# Patient Record
Sex: Female | Born: 1985 | Race: White | Hispanic: No | Marital: Single | Smoking: Current every day smoker
Health system: Southern US, Community
[De-identification: ages and names within clinical notes are randomized; demographics above are authoritative.]

## PROBLEM LIST (undated history)

## (undated) DIAGNOSIS — F410 Panic disorder [episodic paroxysmal anxiety] without agoraphobia: Secondary | ICD-10-CM

## (undated) DIAGNOSIS — G4700 Insomnia, unspecified: Secondary | ICD-10-CM

## (undated) DIAGNOSIS — F39 Unspecified mood [affective] disorder: Secondary | ICD-10-CM

## (undated) DIAGNOSIS — Z9889 Other specified postprocedural states: Secondary | ICD-10-CM

## (undated) DIAGNOSIS — F319 Bipolar disorder, unspecified: Secondary | ICD-10-CM

## (undated) DIAGNOSIS — R200 Anesthesia of skin: Secondary | ICD-10-CM

## (undated) DIAGNOSIS — R112 Nausea with vomiting, unspecified: Secondary | ICD-10-CM

## (undated) DIAGNOSIS — F419 Anxiety disorder, unspecified: Secondary | ICD-10-CM

## (undated) DIAGNOSIS — K219 Gastro-esophageal reflux disease without esophagitis: Secondary | ICD-10-CM

## (undated) DIAGNOSIS — O24419 Gestational diabetes mellitus in pregnancy, unspecified control: Secondary | ICD-10-CM

## (undated) DIAGNOSIS — R569 Unspecified convulsions: Secondary | ICD-10-CM

## (undated) HISTORY — DX: Anxiety disorder, unspecified: F41.9

## (undated) HISTORY — DX: Insomnia, unspecified: G47.00

## (undated) HISTORY — DX: Unspecified convulsions: R56.9

## (undated) HISTORY — DX: Anesthesia of skin: R20.0

## (undated) HISTORY — PX: TUBAL LIGATION: SHX77

## (undated) HISTORY — PX: TONSILLECTOMY: SUR1361

## (undated) HISTORY — DX: Panic disorder (episodic paroxysmal anxiety): F41.0

## (undated) HISTORY — DX: Gastro-esophageal reflux disease without esophagitis: K21.9

## (undated) HISTORY — DX: Unspecified mood (affective) disorder: F39

---

## 2011-10-11 ENCOUNTER — Emergency Department (HOSPITAL_COMMUNITY)
Admission: EM | Admit: 2011-10-11 | Discharge: 2011-10-11 | Disposition: A | Discharge status: Home / Self Care | Payer: Medicaid Other | Attending: Emergency Medicine | Admitting: Emergency Medicine

## 2011-10-11 ENCOUNTER — Encounter (HOSPITAL_COMMUNITY): Payer: Self-pay | Admitting: *Deleted

## 2011-10-11 DIAGNOSIS — Z79899 Other long term (current) drug therapy: Secondary | ICD-10-CM | POA: Insufficient documentation

## 2011-10-11 DIAGNOSIS — R109 Unspecified abdominal pain: Secondary | ICD-10-CM | POA: Insufficient documentation

## 2011-10-11 DIAGNOSIS — N390 Urinary tract infection, site not specified: Secondary | ICD-10-CM

## 2011-10-11 DIAGNOSIS — F17200 Nicotine dependence, unspecified, uncomplicated: Secondary | ICD-10-CM | POA: Insufficient documentation

## 2011-10-11 LAB — CBC WITH DIFFERENTIAL/PLATELET
Basophils Absolute: 0 10*3/uL (ref 0.0–0.1)
Basophils Relative: 0 % (ref 0–1)
Eosinophils Absolute: 0.4 10*3/uL (ref 0.0–0.7)
Eosinophils Relative: 3 % (ref 0–5)
HCT: 40.3 % (ref 36.0–46.0)
MCH: 30.7 pg (ref 26.0–34.0)
MCHC: 34.2 g/dL (ref 30.0–36.0)
MCV: 89.8 fL (ref 78.0–100.0)
Monocytes Absolute: 1.4 10*3/uL — ABNORMAL HIGH (ref 0.1–1.0)
Platelets: 162 10*3/uL (ref 150–400)
RDW: 13.5 % (ref 11.5–15.5)
WBC: 13.1 10*3/uL — ABNORMAL HIGH (ref 4.0–10.5)

## 2011-10-11 LAB — URINE MICROSCOPIC-ADD ON

## 2011-10-11 LAB — COMPREHENSIVE METABOLIC PANEL
ALT: 9 U/L (ref 0–35)
AST: 13 U/L (ref 0–37)
Calcium: 9.4 mg/dL (ref 8.4–10.5)
Creatinine, Ser: 0.78 mg/dL (ref 0.50–1.10)
GFR calc non Af Amer: >90 mL/min (ref >90)
Sodium: 136 mEq/L (ref 135–145)
Total Protein: 6.9 g/dL (ref 6.0–8.3)

## 2011-10-11 LAB — URINALYSIS, ROUTINE W REFLEX MICROSCOPIC
Bilirubin Urine: NEGATIVE
Ketones, ur: NEGATIVE mg/dL
Protein, ur: 100 mg/dL — AB
Urobilinogen, UA: 0.2 mg/dL (ref 0.0–1.0)

## 2011-10-11 MED ORDER — TRAMADOL HCL 50 MG PO TABS: 50.0000 mg | ORAL_TABLET | Freq: Four times a day (QID) | ORAL | Status: AC | PRN

## 2011-10-11 MED ORDER — CEPHALEXIN 500 MG PO CAPS
500.0000 mg | ORAL_CAPSULE | Freq: Once | ORAL | Status: AC
  Administered 2011-10-11: 500 mg via ORAL
  Filled 2011-10-11: qty 1

## 2011-10-11 MED ORDER — CEPHALEXIN 500 MG PO CAPS: 500.0000 mg | ORAL_CAPSULE | Freq: Four times a day (QID) | ORAL | Status: AC

## 2011-10-11 NOTE — ED Notes (Signed)
Delay explained to pt, awaiting md and test results.

## 2011-10-11 NOTE — ED Provider Notes (Cosign Needed)
History     CSN: [id number]  Arrival date & time 10/11/11  1214   First MD Initiated Contact with Patient 10/11/11 1318      Chief Complaint  Patient presents with  . Abdominal Pain    (Consider location/radiation/quality/duration/timing/severity/associated sxs/prior treatment) Patient is a 26 y.o. female presenting with abdominal pain. The history is provided by the patient (the pt complains of lower abd pain). No language interpreter was used.  Abdominal Pain The primary symptoms of the illness include abdominal pain. The primary symptoms of the illness do not include fever, fatigue or diarrhea. The current episode started 2 days ago. The onset of the illness was gradual. The problem has not changed since onset. Associated with: worse with urination. The patient states that she believes she is currently not pregnant. The patient has not had a change in bowel habit. Symptoms associated with the illness do not include chills, hematuria, frequency or back pain. Significant associated medical issues do not include PUD.    History reviewed. No pertinent past medical history.  Past Surgical History  Procedure Date  . Cesarean section   . Tonsillectomy     History reviewed. No pertinent family history.  History  Substance Use Topics  . Smoking status: Current Some Day Smoker  . Smokeless tobacco: Not on file  . Alcohol Use: Yes    OB History    Grav Para Term Preterm Abortions TAB SAB Ect Mult Living                  Review of Systems  Constitutional: Negative for fever, chills and fatigue.  HENT: Negative for congestion, sinus pressure and ear discharge.   Eyes: Negative for discharge.  Respiratory: Negative for cough.   Cardiovascular: Negative for chest pain.  Gastrointestinal: Positive for abdominal pain. Negative for diarrhea.  Genitourinary: Negative for frequency and hematuria.  Musculoskeletal: Negative for back pain.  Skin: Negative for rash.  Neurological:  Negative for seizures and headaches.  Hematological: Negative.   Psychiatric/Behavioral: Negative for hallucinations.    Allergies  Morphine and related  Home Medications   Current Outpatient Rx  Name Route Sig Dispense Refill  . ACETAMINOPHEN 500 MG PO TABS Oral Take 1,000 mg by mouth every 6 (six) hours as needed. For pain    . GOODYS BODY PAIN PO Oral Take 1 packet by mouth daily as needed. For pain    . IBUPROFEN 200 MG PO TABS Oral Take 800 mg by mouth every 8 (eight) hours as needed. For pain    . ACIDOPHILUS PO Oral Take 1 capsule by mouth daily.    Marland Kitchen LEVONORGESTREL 20 MCG/24HR IU IUD Intrauterine 1 each by Intrauterine route once.    Marland Kitchen LYSINE PO Oral Take 1 tablet by mouth daily.    Marland Kitchen VITAMIN B-12 100 MCG PO TABS Oral Take 100 mcg by mouth daily.    . CEPHALEXIN 500 MG PO CAPS Oral Take 1 capsule (500 mg total) by mouth 4 (four) times daily. 28 capsule 0  . TRAMADOL HCL 50 MG PO TABS Oral Take 1 tablet (50 mg total) by mouth every 6 (six) hours as needed for pain. 15 tablet 0    BP 122/62  Pulse 83  Temp 98.4 F (36.9 C) (Oral)  Resp 18  Ht 5\' 2"  (1.575 m)  Wt 151 lb (68.493 kg)  BMI 27.62 kg/m2  SpO2 97%  LMP 09/03/2011  Physical Exam  Constitutional: She is oriented to person, place, and time. She  appears well-developed.  HENT:  Head: Normocephalic and atraumatic.  Eyes: Conjunctivae and EOM are normal. No scleral icterus.  Neck: Neck supple. No thyromegaly present.  Cardiovascular: Normal rate and regular rhythm.  Exam reveals no gallop and no friction rub.   No murmur heard. Pulmonary/Chest: No stridor. She has no wheezes. She has no rales. She exhibits no tenderness.  Abdominal: She exhibits no distension. There is tenderness. There is no rebound.       Tender right lower quad.  mild  Musculoskeletal: Normal range of motion. She exhibits no edema.  Lymphadenopathy:    She has no cervical adenopathy.  Neurological: She is oriented to person, place, and  time. Coordination normal.  Skin: No rash noted. No erythema.  Psychiatric: She has a normal mood and affect. Her behavior is normal.    ED Course  Procedures (including critical care time)  Labs Reviewed  URINALYSIS, ROUTINE W REFLEX MICROSCOPIC - Abnormal; Notable for the following:    APPearance CLOUDY (*)     Hgb urine dipstick MODERATE (*)     Protein, ur 100 (*)     Nitrite POSITIVE (*)     Leukocytes, UA MODERATE (*)     All other components within normal limits  CBC WITH DIFFERENTIAL - Abnormal; Notable for the following:    WBC 13.1 (*)     Neutro Abs 9.4 (*)     Monocytes Absolute 1.4 (*)     All other components within normal limits  COMPREHENSIVE METABOLIC PANEL - Abnormal; Notable for the following:    Glucose, Bld 108 (*)     All other components within normal limits  URINE MICROSCOPIC-ADD ON - Abnormal; Notable for the following:    Bacteria, UA MANY (*)     All other components within normal limits  PREGNANCY, URINE  URINE CULTURE   No results found.   1. UTI (lower urinary tract infection)       MDM          Benny Lennert, MD 10/11/11 1540

## 2011-10-11 NOTE — ED Notes (Signed)
Pt awaiting md.  

## 2011-10-11 NOTE — ED Notes (Signed)
Pt reports left lower quad pain yesterday, that has gotten better and now having right lower quad pain, also has noticed at foul odor from vagina for several days, denies any discharge.  Or fever, she thinks she may have uti.  She also felt for her iud and "it felt lower than normal"

## 2011-10-11 NOTE — ED Notes (Signed)
Had LLQ pain yesterday, now RLQ pain,Nausea, no vomiting,   No diarrhea.

## 2011-10-13 LAB — URINE CULTURE

## 2011-10-14 NOTE — ED Notes (Signed)
+  Urine. Patient given Keflex. Sensitive to same. Per protocol MD. °

## 2012-02-19 ENCOUNTER — Other Ambulatory Visit: Payer: Self-pay | Admitting: Neurology

## 2012-02-19 DIAGNOSIS — R209 Unspecified disturbances of skin sensation: Secondary | ICD-10-CM

## 2012-02-26 ENCOUNTER — Ambulatory Visit (HOSPITAL_COMMUNITY)
Admission: RE | Admit: 2012-02-26 | Discharge: 2012-02-26 | Disposition: A | Discharge status: Home / Self Care | Payer: Medicaid Other | Source: Ambulatory Visit | Attending: Neurology | Admitting: Neurology

## 2012-02-26 DIAGNOSIS — R209 Unspecified disturbances of skin sensation: Secondary | ICD-10-CM | POA: Insufficient documentation

## 2012-02-26 DIAGNOSIS — R42 Dizziness and giddiness: Secondary | ICD-10-CM | POA: Insufficient documentation

## 2012-12-05 ENCOUNTER — Emergency Department (HOSPITAL_COMMUNITY): Payer: Medicaid Other

## 2012-12-05 ENCOUNTER — Emergency Department (HOSPITAL_COMMUNITY)
Admission: EM | Admit: 2012-12-05 | Discharge: 2012-12-05 | Disposition: A | Discharge status: Home / Self Care | Payer: Medicaid Other | Attending: Emergency Medicine | Admitting: Emergency Medicine

## 2012-12-05 ENCOUNTER — Encounter (HOSPITAL_COMMUNITY): Payer: Self-pay | Admitting: Emergency Medicine

## 2012-12-05 DIAGNOSIS — Z3202 Encounter for pregnancy test, result negative: Secondary | ICD-10-CM | POA: Insufficient documentation

## 2012-12-05 DIAGNOSIS — W010XXA Fall on same level from slipping, tripping and stumbling without subsequent striking against object, initial encounter: Secondary | ICD-10-CM | POA: Insufficient documentation

## 2012-12-05 DIAGNOSIS — Z8632 Personal history of gestational diabetes: Secondary | ICD-10-CM | POA: Insufficient documentation

## 2012-12-05 DIAGNOSIS — Z79899 Other long term (current) drug therapy: Secondary | ICD-10-CM | POA: Insufficient documentation

## 2012-12-05 DIAGNOSIS — Y9301 Activity, walking, marching and hiking: Secondary | ICD-10-CM | POA: Insufficient documentation

## 2012-12-05 DIAGNOSIS — F17200 Nicotine dependence, unspecified, uncomplicated: Secondary | ICD-10-CM | POA: Insufficient documentation

## 2012-12-05 DIAGNOSIS — S300XXA Contusion of lower back and pelvis, initial encounter: Secondary | ICD-10-CM | POA: Insufficient documentation

## 2012-12-05 DIAGNOSIS — Y92009 Unspecified place in unspecified non-institutional (private) residence as the place of occurrence of the external cause: Secondary | ICD-10-CM | POA: Insufficient documentation

## 2012-12-05 HISTORY — DX: Gestational diabetes mellitus in pregnancy, unspecified control: O24.419

## 2012-12-05 MED ORDER — IBUPROFEN 800 MG PO TABS: 800.0000 mg | ORAL_TABLET | Freq: Three times a day (TID) | ORAL | Status: DC

## 2012-12-05 NOTE — ED Provider Notes (Signed)
CSN: [id number]     Arrival date & time 12/05/12  1946 History  This chart was scribed for non-physician practitioner Jaynie Crumble, PA-C working with Flint Melter, MD by Valera Castle, ED scribe. This patient was seen in room TR09C/TR09C and the patient's care was started at 8:17 PM.   Chief Complaint  Patient presents with  . Fall    HPI HPI Comments: Sophia Douglas is a 27 y.o. female who presents to the Emergency Department complaining of an accidental fall that occurred last night. She states she was walking on carpeted steps, slipped an fell on her buttocks. She reports constant, moderate pain to the area of her sacrum/coccyx that is made worse by sitting. She denies head injury and LOC. She denies any previous injury. She has tried ibuprofen and tylenol with no relief. She states the pain does not radiate to her lower extremities. She reports she is unsure if she is pregnant.    Past Medical History  Diagnosis Date  . Gestational diabetes    Past Surgical History  Procedure Laterality Date  . Cesarean section    . Tonsillectomy     No family history on file. History  Substance Use Topics  . Smoking status: Current Some Day Smoker  . Smokeless tobacco: Not on file  . Alcohol Use: Yes   OB History   Grav Para Term Preterm Abortions TAB SAB Ect Mult Living                 Review of Systems  HENT: Negative for neck pain.   Musculoskeletal: Positive for arthralgias (Sacrum/coccyx).  Neurological: Negative for syncope and headaches.  All other systems reviewed and are negative.    Allergies  Tramadol; Latex; and Morphine and related  Home Medications   Current Outpatient Rx  Name  Route  Sig  Dispense  Refill  . acetaminophen (TYLENOL) 500 MG tablet   Oral   Take 1,000 mg by mouth every 6 (six) hours as needed. For pain         . ALPRAZolam (XANAX) 0.25 MG tablet   Oral   Take 0.25 mg by mouth 3 (three) times daily.         . ARIPiprazole (ABILIFY)  5 MG tablet   Oral   Take 5 mg by mouth daily.         . Aspirin-Acetaminophen (GOODYS BODY PAIN PO)   Oral   Take 1 packet by mouth daily as needed. For pain         . desogestrel-ethinyl estradiol (VELIVET,CAZIANT,CESIA,CYCLESSA) 0.1/0.125/0.15 -0.025 MG tablet   Oral   Take 1 tablet by mouth daily.         Marland Kitchen ibuprofen (ADVIL,MOTRIN) 200 MG tablet   Oral   Take 400 mg by mouth every 8 (eight) hours as needed. For pain         . Lactobacillus (ACIDOPHILUS PO)   Oral   Take 1 capsule by mouth daily.         Marland Kitchen LYSINE PO   Oral   Take 1 tablet by mouth as needed (cold sore).          Marland Kitchen omeprazole (PRILOSEC) 20 MG capsule   Oral   Take 20 mg by mouth daily.         . sertraline (ZOLOFT) 100 MG tablet   Oral   Take 100 mg by mouth daily.         . vitamin B-12 (CYANOCOBALAMIN) 100  MCG tablet   Oral   Take 100 mcg by mouth daily.         Marland Kitchen zolpidem (AMBIEN) 10 MG tablet   Oral   Take 10 mg by mouth at bedtime.          Triage Vitals: BP 127/86  Pulse 89  Temp(Src) 99.2 F (37.3 C) (Oral)  Resp 14  SpO2 100%  Physical Exam  Nursing note and vitals reviewed. Constitutional: She is oriented to person, place, and time. She appears well-developed and well-nourished. No distress.  HENT:  Head: Normocephalic and atraumatic.  Eyes: EOM are normal.  Neck: Neck supple. No tracheal deviation present.  Cardiovascular: Normal rate.   Pulmonary/Chest: Effort normal. No respiratory distress.  Musculoskeletal: Normal range of motion.  No midline l-spine tenderness. Tender over right paravertebral muscles of mid l-spine. Midline sacral, and coccyx tenderness.  Neurological: She is alert and oriented to person, place, and time.  5/5 and equal strength in the lower extremities.   Skin: Skin is warm and dry.  Psychiatric: She has a normal mood and affect. Her behavior is normal.    ED Course  Procedures (including critical care time)  DIAGNOSTIC  STUDIES: Oxygen Saturation is 100% on room air, normal by my interpretation.    COORDINATION OF CARE: 8:20 PM-Discussed treatment plan which includes obtaining an xray of her sacrum/coccyx, after first ruling out pregnancy with pt at bedside and pt agreed to plan.      Labs Review Labs Reviewed - No data to display Imaging Review No results found.  MDM   1. Contusion of coccyx, initial encounter     Patient in emergency department with pain to her tail bone and sacrum after falling onto it yesterday while she slipped on the stairs. Patient is tender to palpation of her sacrum and tailbone, no deformity noted. X-rays obtained and are negative. I suspect she has a sacral and tailbone contusion. She is neurovascularly intact. She is ambulatory. She is in no distress. She has no complaints. There is no other injuries during the fall. She'll be treated with ibuprofen for pain and follow up as needed    Filed Vitals:   12/05/12 1955  BP: 127/86  Pulse: 89  Temp: 99.2 F (37.3 C)  TempSrc: Oral  Resp: 14  SpO2: 100%   I personally performed the services described in this documentation, which was scribed in my presence. The recorded information has been reviewed and is accurate.    Lottie Mussel, PA-C 12/05/12 2106

## 2012-12-05 NOTE — ED Notes (Signed)
Pt. tripped and fell last night at friend's house , no LOC / ambulatory , reports pain at sacral area.

## 2012-12-05 NOTE — ED Notes (Signed)
Pt has ride home.

## 2012-12-06 NOTE — ED Provider Notes (Signed)
Medical screening examination/treatment/procedure(s) were performed by non-physician practitioner and as supervising physician I was immediately available for consultation/collaboration.  Flint Melter, MD 12/06/12 [phone]

## 2013-01-14 ENCOUNTER — Ambulatory Visit: Payer: Medicaid Other | Admitting: Diagnostic Neuroimaging

## 2014-08-19 ENCOUNTER — Ambulatory Visit (INDEPENDENT_AMBULATORY_CARE_PROVIDER_SITE_OTHER): Payer: Medicaid Other | Admitting: Neurology

## 2014-08-19 ENCOUNTER — Encounter: Payer: Self-pay | Admitting: Neurology

## 2014-08-19 VITALS — BP 143/98 | HR 84 | Ht 62.0 in | Wt 203.0 lb

## 2014-08-19 DIAGNOSIS — R202 Paresthesia of skin: Secondary | ICD-10-CM | POA: Diagnosis not present

## 2014-08-19 MED ORDER — LAMOTRIGINE 25 MG PO TABS: ORAL_TABLET | ORAL | Status: DC

## 2014-08-19 MED ORDER — LAMICTAL 100 MG PO TABS: ORAL_TABLET | ORAL | Status: DC
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
## 2014-08-19 NOTE — Addendum Note (Signed)
Addended by: Levert FeinsteinYAN, Pearse Shiffler on: 08/19/2014 08:59 AM   Modules accepted: Orders

## 2014-09-02 ENCOUNTER — Ambulatory Visit
Admission: RE | Admit: 2014-09-02 | Discharge: 2014-09-02 | Disposition: A | Discharge status: Home / Self Care | Payer: Medicaid Other | Source: Ambulatory Visit | Attending: Neurology | Admitting: Neurology

## 2014-09-02 ENCOUNTER — Ambulatory Visit: Payer: Medicaid Other

## 2014-09-02 ENCOUNTER — Encounter (INDEPENDENT_AMBULATORY_CARE_PROVIDER_SITE_OTHER): Payer: Medicaid Other | Admitting: Diagnostic Neuroimaging

## 2014-09-02 DIAGNOSIS — R202 Paresthesia of skin: Secondary | ICD-10-CM

## 2014-09-02 MED ORDER — GADOBENATE DIMEGLUMINE 529 MG/ML IV SOLN
20.0000 mL | Freq: Once | INTRAVENOUS | Status: AC | PRN
  Administered 2014-09-02: 20 mL via INTRAVENOUS

## 2014-09-03 ENCOUNTER — Telehealth: Payer: Self-pay

## 2014-09-03 ENCOUNTER — Telehealth: Payer: Self-pay | Admitting: *Deleted

## 2014-09-03 NOTE — Telephone Encounter (Signed)
-----   Message from Levert FeinsteinYijun Yan, MD sent at 09/03/2014  1:10 PM EDT ----- Please call pt for normal MRI brain.

## 2014-09-03 NOTE — Telephone Encounter (Signed)
Reviewed, MRI brain is normal, EEG is pending, will have follow up in June 22nd, will go over plan at that time, lamictal is working for her now

## 2014-09-03 NOTE — Telephone Encounter (Signed)
Patient aware of normal results

## 2014-09-03 NOTE — Telephone Encounter (Signed)
Patient informed of normal MRI, she states Lamictal is working and she is keeping a Journal of events.  She would like for Dr. Terrace ArabiaYan to call and give her a plan of care

## 2014-09-08 ENCOUNTER — Ambulatory Visit (INDEPENDENT_AMBULATORY_CARE_PROVIDER_SITE_OTHER): Payer: Medicaid Other | Admitting: Neurology

## 2014-09-08 DIAGNOSIS — R202 Paresthesia of skin: Secondary | ICD-10-CM

## 2014-09-11 NOTE — Procedures (Signed)
   HISTORY: 29 years old female, presenting with recurrent left-sided twitching, paresthesia  TECHNIQUE:  16 channel EEG was performed based on standard 10-16 international system. One channel was dedicated to EKG, which has demonstrates normal sinus rhythm of 78 beats per minutes.  Upon awakening, the posterior background activity was well-developed, in alpha range 10 Hz, with amplitude of 40 microvoltage, reactive to eye opening and closure.  There was no evidence of epileptiform discharge.  Photic stimulation was performed, which induced a symmetric photic driving.  Hyperventilation was performed, there was no abnormality elicit.  No sleep was achieved.  CONCLUSION: This is a  normal awake EEG.  There is no electrodiagnostic evidence of epileptiform discharge

## 2014-09-23 ENCOUNTER — Ambulatory Visit (INDEPENDENT_AMBULATORY_CARE_PROVIDER_SITE_OTHER): Payer: Medicaid Other | Admitting: Neurology

## 2014-09-23 ENCOUNTER — Encounter: Payer: Self-pay | Admitting: Neurology

## 2014-09-23 VITALS — BP 114/68 | HR 72 | Ht 62.0 in | Wt 205.0 lb

## 2014-09-23 DIAGNOSIS — R569 Unspecified convulsions: Secondary | ICD-10-CM | POA: Diagnosis not present

## 2014-09-23 DIAGNOSIS — G43009 Migraine without aura, not intractable, without status migrainosus: Secondary | ICD-10-CM

## 2014-09-23 DIAGNOSIS — R202 Paresthesia of skin: Secondary | ICD-10-CM | POA: Diagnosis not present

## 2014-09-23 MED ORDER — LAMICTAL 100 MG PO TABS: ORAL_TABLET | ORAL | Status: DC

## 2014-09-23 MED ORDER — RIZATRIPTAN BENZOATE 5 MG PO TBDP: 5.0000 mg | ORAL_TABLET | ORAL | Status: DC | PRN
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
## 2014-12-01 ENCOUNTER — Telehealth: Payer: Self-pay | Admitting: Neurology

## 2014-12-01 NOTE — Telephone Encounter (Signed)
Pt uses transportation to come the office and is going to come for lab work.Marland KitchenMarland KitchenThey will call to check appt time , Sept 15 at 8am. Please relay this to the person calling to verify.

## 2014-12-17 ENCOUNTER — Other Ambulatory Visit (INDEPENDENT_AMBULATORY_CARE_PROVIDER_SITE_OTHER): Payer: Self-pay

## 2014-12-17 DIAGNOSIS — R202 Paresthesia of skin: Secondary | ICD-10-CM

## 2014-12-17 DIAGNOSIS — R569 Unspecified convulsions: Secondary | ICD-10-CM

## 2014-12-17 DIAGNOSIS — G43009 Migraine without aura, not intractable, without status migrainosus: Secondary | ICD-10-CM

## 2014-12-17 DIAGNOSIS — Z0289 Encounter for other administrative examinations: Secondary | ICD-10-CM

## 2014-12-19 LAB — COMPREHENSIVE METABOLIC PANEL
ALBUMIN: 4.6 g/dL (ref 3.5–5.5)
ALT: 17 IU/L (ref 0–32)
AST: 13 IU/L (ref 0–40)
Albumin/Globulin Ratio: 2.6 — ABNORMAL HIGH (ref 1.1–2.5)
Alkaline Phosphatase: 68 IU/L (ref 39–117)
BUN/Creatinine Ratio: 15 (ref 8–20)
BUN: 14 mg/dL (ref 6–20)
Bilirubin Total: <0.2 mg/dL (ref 0.0–1.2)
CO2: 20 mmol/L (ref 18–29)
CREATININE: 0.93 mg/dL (ref 0.57–1.00)
Calcium: 9.1 mg/dL (ref 8.7–10.2)
Chloride: 105 mmol/L (ref 97–108)
GFR calc non Af Amer: 84 mL/min/{1.73_m2} (ref >59)
GFR, EST AFRICAN AMERICAN: 97 mL/min/{1.73_m2} (ref >59)
GLUCOSE: 101 mg/dL — AB (ref 65–99)
Globulin, Total: 1.8 g/dL (ref 1.5–4.5)
Potassium: 4 mmol/L (ref 3.5–5.2)
Sodium: 143 mmol/L (ref 134–144)
TOTAL PROTEIN: 6.4 g/dL (ref 6.0–8.5)

## 2014-12-19 LAB — CBC
HEMOGLOBIN: 12.8 g/dL (ref 11.1–15.9)
Hematocrit: 39.6 % (ref 34.0–46.6)
MCH: 29.3 pg (ref 26.6–33.0)
MCHC: 32.3 g/dL (ref 31.5–35.7)
MCV: 91 fL (ref 79–97)
Platelets: 199 10*3/uL (ref 150–379)
RBC: 4.37 x10E6/uL (ref 3.77–5.28)
RDW: 14.3 % (ref 12.3–15.4)
WBC: 8.6 10*3/uL (ref 3.4–10.8)

## 2014-12-19 LAB — LAMOTRIGINE LEVEL: LAMOTRIGINE LVL: 6.9 ug/mL (ref 2.0–20.0)

## 2014-12-19 LAB — TOPIRAMATE LEVEL: Topiramate Lvl: 5.8 ug/mL (ref 2.0–25.0)

## 2014-12-21 ENCOUNTER — Telehealth: Payer: Self-pay | Admitting: *Deleted

## 2014-12-21 NOTE — Telephone Encounter (Signed)
Patient aware of lab results.

## 2014-12-24 ENCOUNTER — Encounter: Payer: Self-pay | Admitting: Neurology

## 2014-12-24 ENCOUNTER — Ambulatory Visit (INDEPENDENT_AMBULATORY_CARE_PROVIDER_SITE_OTHER): Payer: Medicaid Other | Admitting: Neurology

## 2014-12-24 VITALS — BP 103/71 | HR 73 | Ht 62.0 in | Wt 203.0 lb

## 2014-12-24 DIAGNOSIS — R569 Unspecified convulsions: Secondary | ICD-10-CM | POA: Diagnosis not present

## 2014-12-24 DIAGNOSIS — F329 Major depressive disorder, single episode, unspecified: Secondary | ICD-10-CM | POA: Diagnosis not present

## 2014-12-24 DIAGNOSIS — G43009 Migraine without aura, not intractable, without status migrainosus: Secondary | ICD-10-CM

## 2014-12-24 DIAGNOSIS — F32A Depression, unspecified: Secondary | ICD-10-CM
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
## 2014-12-30 DIAGNOSIS — Z0271 Encounter for disability determination: Secondary | ICD-10-CM

## 2015-03-09 ENCOUNTER — Encounter: Payer: Self-pay | Admitting: Internal Medicine

## 2015-03-25 ENCOUNTER — Ambulatory Visit (INDEPENDENT_AMBULATORY_CARE_PROVIDER_SITE_OTHER): Payer: Medicaid Other | Admitting: Gastroenterology

## 2015-03-25 ENCOUNTER — Encounter: Payer: Self-pay | Admitting: Gastroenterology

## 2015-03-25 VITALS — BP 124/72 | HR 93 | Temp 97.3°F | Ht 62.0 in | Wt 208.4 lb

## 2015-03-25 DIAGNOSIS — R1314 Dysphagia, pharyngoesophageal phase: Secondary | ICD-10-CM

## 2015-03-25 DIAGNOSIS — K219 Gastro-esophageal reflux disease without esophagitis: Secondary | ICD-10-CM | POA: Diagnosis not present

## 2015-03-25 DIAGNOSIS — R197 Diarrhea, unspecified: Secondary | ICD-10-CM | POA: Diagnosis not present

## 2015-03-25 DIAGNOSIS — R1310 Dysphagia, unspecified: Secondary | ICD-10-CM | POA: Diagnosis not present

## 2015-03-25 DIAGNOSIS — R1319 Other dysphagia: Secondary | ICD-10-CM

## 2015-03-25 MED ORDER — DEXLANSOPRAZOLE 60 MG PO CPDR: 60.0000 mg | DELAYED_RELEASE_CAPSULE | Freq: Every day | ORAL | Status: DC

## 2015-03-25 NOTE — Assessment & Plan Note (Signed)
29 year old female with 9 year history of typical heartburn who presents for refractory symptoms. She is failed multiple PPIs as outlined above. No prior upper endoscopy. Recommend upper endoscopy for further evaluation. In addition she has dysphagia/odynophagia and may need to have esophageal dilation. She is on multiple medications and describes significant anxiety therefore we will plan on deep sedation in the OR.  I have discussed the risks, alternatives, benefits with regards to but not limited to the risk of reaction to medication, bleeding, infection, perforation and the patient is agreeable to proceed. Written consent to be obtained.  If celiac screen is positive she will get a small bowel biopsy at the same time. She may ultimately require colonoscopy at a later date to further evaluate chronic diarrhea but will follow-up on that later as discussed with patient. Await stools and labs.

## 2015-03-25 NOTE — Assessment & Plan Note (Signed)
Chronic alternating constipation and diarrhea, more tendency towards diarrhea and only diarrhea for the past few weeks. Recent antibiotic use. Symptoms worse from baseline. We will check stool studies and labs. Celiac screen. Based on workup, would consider anti-spasmodic or medication such as Viberzi.

## 2015-03-25 NOTE — Patient Instructions (Signed)
1. Please have your labs and stool studies done ASAP. 2. Start Dexilant one capsule 30 minutes before your first meal of the day. Samples provided. If they work let me know, we will send in a prescription. 3. Upper endoscopy as discussed. See separate instructions. 4. Based on your labs and stool studies, we will start medication for IBS-diarrhea.

## 2015-03-25 NOTE — Progress Notes (Signed)
Primary Care Physician:  Remus Loffler, PA-C  Primary Gastroenterologist:  Roetta Sessions, MD   Chief Complaint  Patient presents with  . Gastroesophageal Reflux    HPI:  Sophia Douglas is a 29 y.o. female here for further evaluation of refractory GERD at the request of her PCP. Previously failed prilosec, protonix, OTC Nexium, Zantac, Prevacid. GERD for 9 years. Heartburn in epigastric region, heartburn. Scared to eat. Vanilla milkshake provides relief. Pill and solid food dysphagia. Odynophagia with swallowing. Weight loss of 7 pounds in past two weeks. Decreased eating. IBS medication in high school. Either constipation and diarrhea. Constipation associated with bloating and abdominal pain. Has had just diarrhea for past 3 weeks, urgency, postprandial 5+ stools per day. Some nocturnal symptoms. Recently took levaquin for bronchitis.   No prior egd/tcs.   Current Outpatient Prescriptions  Medication Sig Dispense Refill  . ALPRAZolam (XANAX) 0.5 MG tablet Take 0.5 mg by mouth 3 (three) times daily as needed for anxiety.    Marland Kitchen FLUCONAZOLE PO Take by mouth as needed.    Marland Kitchen LAMICTAL 100 MG tablet One and 1/2 tabs po bid 90 tablet 11  . QUEtiapine (SEROQUEL) 200 MG tablet Take 200 mg by mouth at bedtime.    . rizatriptan (MAXALT-MLT) 5 MG disintegrating tablet Take 1 tablet (5 mg total) by mouth as needed. May repeat in 2 hours if needed 12 tablet 6  . sertraline (ZOLOFT) 100 MG tablet Take 100 mg by mouth daily.    Marland Kitchen topiramate (TOPAMAX) 100 MG tablet Take 200 mg by mouth at bedtime.     No current facility-administered medications for this visit.    Allergies as of 03/25/2015 - Review Complete 03/25/2015  Allergen Reaction Noted  . Latex Rash 12/05/2012  . Morphine and related Swelling and Rash 10/11/2011    Past Medical History  Diagnosis Date  . Gestational diabetes   . Numbness     Left arm and left leg  . GERD (gastroesophageal reflux disease)   . Mood disorder (HCC)   .  Insomnia   . Panic disorder   . Anxiety disorder   . Seizures [facility])     Past Surgical History  Procedure Laterality Date  . Cesarean section    . Tonsillectomy    . Tubal ligation      Family History  Problem Relation Age of Onset  . Diabetes Mother   . Diabetes Maternal Grandmother   . Hypercholesterolemia Father   . Hypertension Father   . Neuropathy Father   . Heart disease Maternal Grandfather   . Kidney disease Maternal Grandmother   . Colon cancer Neg Hx   . Inflammatory bowel disease Neg Hx     Social History   Social History  . Marital Status: Single    Spouse Name: N/A  . Number of Children: 2  . Years of Education: 12   Occupational History  . Unemployed    Social History Main Topics  . Smoking status: Current Some Day Smoker -- 1.00 packs/day    Types: Cigarettes  . Smokeless tobacco: Never Used  . Alcohol Use: No  . Drug Use: No  . Sexual Activity: Yes    Birth Control/ Protection: IUD   Other Topics Concern  . Not on file   Social History Narrative   Lives at home with her boyfriend and two children (son and daughter). Patient is unemployed.   Right-handed.   Occasional caffeine use.      ROS:  General:  Negative for anorexia, weight loss, fever, chills, fatigue, weakness. Eyes: Negative for vision changes.  ENT: Negative for hoarseness, difficulty swallowing , nasal congestion. CV: Negative for chest pain, angina, palpitations, dyspnea on exertion, peripheral edema.  Respiratory: Negative for dyspnea at rest, dyspnea on exertion, cough, sputum, wheezing.  GI: See history of present illness. GU:  Negative for dysuria, hematuria, urinary incontinence, urinary frequency, nocturnal urination.  MS: Negative for joint pain, low back pain.  Derm: Negative for rash or itching.  Neuro: Negative for weakness, abnormal sensation, seizure, frequent headaches, memory loss, confusion.  Psych: Negative for anxiety, depression, suicidal ideation,  hallucinations.  Endo: Negative for unusual weight change.  Heme: Negative for bruising or bleeding. Allergy: Negative for rash or hives.    Physical Examination:  BP 124/72 mmHg  Pulse 93  Temp(Src) 97.3 F (36.3 C)  Ht 5\' 2"  (1.575 m)  Wt 208 lb 6.4 oz (94.53 kg)  BMI 38.11 kg/m2  LMP 03/19/2015   General: Well-nourished, well-developed in no acute distress.  Head: Normocephalic, atraumatic.   Eyes: Conjunctiva pink, no icterus. Mouth: Oropharyngeal mucosa moist and pink , no lesions erythema or exudate. Neck: Supple without thyromegaly, masses, or lymphadenopathy.  Lungs: Clear to auscultation bilaterally.  Heart: Regular rate and rhythm, no murmurs rubs or gallops.  Abdomen: Bowel sounds are normal, nontender, nondistended, no hepatosplenomegaly or masses, no abdominal bruits or    hernia , no rebound or guarding.   Rectal: not performed Extremities: No lower extremity edema. No clubbing or deformities.  Neuro: Alert and oriented x 4 , grossly normal neurologically.  Skin: Warm and dry, no rash or jaundice.   Psych: Alert and cooperative, normal mood and affect.  Labs: Lab Results  Component Value Date   WBC 8.6 12/17/2014   HGB 13.8 10/11/2011   HCT 39.6 12/17/2014   MCV 89.8 10/11/2011   PLT 162 10/11/2011   Lab Results  Component Value Date   ALT 17 12/17/2014   AST 13 12/17/2014   ALKPHOS 68 12/17/2014   BILITOT <0.2 12/17/2014   Lab Results  Component Value Date   CREATININE 0.93 12/17/2014   BUN 14 12/17/2014   NA 143 12/17/2014   K 4.0 12/17/2014   CL 105 12/17/2014   CO2 20 12/17/2014     Imaging Studies: No results found.

## 2015-03-25 NOTE — Progress Notes (Signed)
CC'ED TO PCP 

## 2015-03-30 ENCOUNTER — Other Ambulatory Visit: Payer: Self-pay

## 2015-03-31 ENCOUNTER — Other Ambulatory Visit: Payer: Self-pay | Admitting: Gastroenterology

## 2015-03-31 LAB — CBC WITH DIFFERENTIAL/PLATELET
Basophils Absolute: 0 10*3/uL (ref 0.0–0.1)
Basophils Relative: 0 % (ref 0–1)
Eosinophils Absolute: 0.3 10*3/uL (ref 0.0–0.7)
Eosinophils Relative: 3 % (ref 0–5)
HCT: 39.9 % (ref 36.0–46.0)
HEMOGLOBIN: 13.2 g/dL (ref 12.0–15.0)
Lymphocytes Relative: 30 % (ref 12–46)
Lymphs Abs: 3 10*3/uL (ref 0.7–4.0)
MCH: 29.2 pg (ref 26.0–34.0)
MCHC: 33.1 g/dL (ref 30.0–36.0)
MCV: 88.3 fL (ref 78.0–100.0)
MONOS PCT: 9 % (ref 3–12)
MPV: 10.7 fL (ref 8.6–12.4)
Monocytes Absolute: 0.9 10*3/uL (ref 0.1–1.0)
NEUTROS ABS: 5.9 10*3/uL (ref 1.7–7.7)
Neutrophils Relative %: 58 % (ref 43–77)
Platelets: 213 10*3/uL (ref 150–400)
RBC: 4.52 MIL/uL (ref 3.87–5.11)
RDW: 14.2 % (ref 11.5–15.5)
WBC: 10.1 10*3/uL (ref 4.0–10.5)

## 2015-04-01 LAB — GIARDIA/CRYPTOSPORIDIUM (EIA)
Cryptosporidium Screen (EIA): NEGATIVE
Giardia Screen (EIA): NEGATIVE

## 2015-04-01 LAB — COMPREHENSIVE METABOLIC PANEL
ALT: 15 U/L (ref 6–29)
AST: 14 U/L (ref 10–30)
Albumin: 4.4 g/dL (ref 3.6–5.1)
Alkaline Phosphatase: 73 U/L (ref 33–115)
BUN: 14 mg/dL (ref 7–25)
CALCIUM: 9.4 mg/dL (ref 8.6–10.2)
CHLORIDE: 109 mmol/L (ref 98–110)
CO2: 23 mmol/L (ref 20–31)
Creat: 0.92 mg/dL (ref 0.50–1.10)
GLUCOSE: 110 mg/dL — AB (ref 65–99)
POTASSIUM: 3.8 mmol/L (ref 3.5–5.3)
Sodium: 141 mmol/L (ref 135–146)
Total Bilirubin: 0.2 mg/dL (ref 0.2–1.2)
Total Protein: 6.6 g/dL (ref 6.1–8.1)

## 2015-04-01 LAB — TSH: TSH: 3.176 u[IU]/mL (ref 0.350–4.500)

## 2015-04-01 LAB — TISSUE TRANSGLUTAMINASE, IGA: Tissue Transglutaminase Ab, IgA: 1 U/mL (ref <4)

## 2015-04-01 LAB — LIPASE: LIPASE: 18 U/L (ref 7–60)

## 2015-04-04 LAB — STOOL CULTURE

## 2015-04-06 ENCOUNTER — Telehealth: Payer: Self-pay

## 2015-04-06 LAB — CLOSTRIDIUM DIFFICILE BY PCR

## 2015-04-06 NOTE — Telephone Encounter (Signed)
Pt called- left voicemail- requesting results from labs/stools

## 2015-04-07 ENCOUNTER — Other Ambulatory Visit: Payer: Self-pay | Admitting: Gastroenterology

## 2015-04-07 MED ORDER — DICYCLOMINE HCL 10 MG PO CAPS: 10.0000 mg | ORAL_CAPSULE | Freq: Three times a day (TID) | ORAL | Status: DC

## 2015-04-07 NOTE — Telephone Encounter (Signed)
Spoke with the pt, she said the dexilant is working well and needs an rx sent to her pharmacy. She has [place] medicaid and has tried and failed omeprazole and pantoprazole.

## 2015-04-07 NOTE — Progress Notes (Signed)
Quick Note:  Labs ok including celiac screen, thyroid, liver, kidney. Stools negative EXCEPT Cdiff was not done (did she submit solid stool or did lab not do for other reason).   EGD as planned.  Trial of Bentyl 10mg  qac and qah, RX done. ______

## 2015-04-07 NOTE — Telephone Encounter (Signed)
See result note.  

## 2015-04-08 NOTE — Telephone Encounter (Signed)
PA for dexilant has been done and is approved.

## 2015-04-09 MED ORDER — DEXLANSOPRAZOLE 60 MG PO CPDR: 60.0000 mg | DELAYED_RELEASE_CAPSULE | Freq: Every day | ORAL | Status: DC

## 2015-04-09 NOTE — Telephone Encounter (Signed)
RX sent

## 2015-04-09 NOTE — Addendum Note (Signed)
Addended by: Tiffany KocherLEWIS, LESLIE S on: 04/09/2015 11:09 AM   Modules accepted: Orders

## 2015-04-10 NOTE — Progress Notes (Signed)
Quick Note:    noted  ______

## 2015-04-16 NOTE — Patient Instructions (Signed)
Sophia Douglas  04/16/2015     @PREFPERIOPPHARMACY @   Your procedure is scheduled on 04/22/2015.  Report to Sophia Douglas at 8:30 A.M.  Call this number if you have problems the morning of surgery:  [phone]   Remember:  Do not eat food or drink liquids after midnight.  Take these medicines the morning of surgery with A SIP OF WATER : Tito Dine AND ZOLOFT   Do not wear jewelry, make-up or nail polish.  Do not wear lotions, powders, or perfumes.  You may wear deodorant.  Do not shave 48 hours prior to surgery.  Men may shave face and neck.  Do not bring valuables to the hospital.  [facility] is not responsible for any belongings or valuables.  Contacts, dentures or bridgework may not be worn into surgery.  Leave your suitcase in the car.  After surgery it may be brought to your room.  For patients admitted to the hospital, discharge time will be determined by your treatment team.  Patients discharged the day of surgery will not be allowed to drive home.   Name and phone number of your driver:   FAMILY Special instructions:  N/A  Please read over the following fact sheets that you were given. Care and Recovery After Surgery    Esophagogastroduodenoscopy Esophagogastroduodenoscopy (EGD) is a procedure that is used to examine the lining of the esophagus, stomach, and first part of the small intestine (duodenum). A long, flexible, lighted tube with a camera attached (endoscope) is inserted down the throat to view these organs. This procedure is done to detect problems or abnormalities, such as inflammation, bleeding, ulcers, or growths, in order to treat them. The procedure lasts 5-20 minutes. It is usually an outpatient procedure, but it may need to be performed in a hospital in emergency cases. LET [facility] CARE PROVIDER KNOW ABOUT:  Any allergies you have.  All medicines you are taking, including vitamins, herbs, eye drops, creams, and over-the-counter  medicines.  Previous problems you or members of your family have had with the use of anesthetics.  Any blood disorders you have.  Previous surgeries you have had.  Medical conditions you have. RISKS AND COMPLICATIONS Generally, this is a safe procedure. However, problems can occur and include:  Infection.  Bleeding.  Tearing (perforation) of the esophagus, stomach, or duodenum.  Difficulty breathing or not being able to breathe.  Excessive sweating.  Spasms of the larynx.  Slowed heartbeat.  Low blood pressure. BEFORE THE PROCEDURE  Do not eat or drink anything after midnight on the night before the procedure or as directed by your health care provider.  Do not take your regular medicines before the procedure if your health care provider asks you not to. Ask your health care provider about changing or stopping those medicines.  If you wear dentures, be prepared to remove them before the procedure.  Arrange for someone to drive you home after the procedure. PROCEDURE  A numbing medicine (local anesthetic) may be sprayed in your throat for comfort and to stop you from gagging or coughing.  You will have an IV tube inserted in a vein in your hand or arm. You will receive medicines and fluids through this tube.  You will be given a medicine to relax you (sedative).  A pain reliever will be given through the IV tube.  A mouth guard may be placed in your mouth to protect your teeth and to keep you from biting on the endoscope.  You will be asked to lie on your left side.  The endoscope will be inserted down your throat and into your esophagus, stomach, and duodenum.  Air will be put through the endoscope to allow your health care provider to clearly view the lining of your esophagus.  The lining of your esophagus, stomach, and duodenum will be examined. During the exam, your health care provider may:  Remove tissue to be examined under a microscope (biopsy) for  inflammation, infection, or other medical problems.  Remove growths.  Remove objects (foreign bodies) that are stuck.  Treat any bleeding with medicines or other devices that stop tissues from bleeding (hot cautery, clipping devices).  Widen (dilate) or stretch narrowed areas of your esophagus and stomach.  The endoscope will be withdrawn. AFTER THE PROCEDURE  You will be taken to a recovery area for observation. Your blood pressure, heart rate, breathing rate, and blood oxygen level will be monitored often until the medicines you were given have worn off.  Do not eat or drink anything until the numbing medicine has worn off and your gag reflex has returned. You may choke.  Your health care provider should be able to discuss his or her findings with you. It will take longer to discuss the test results if any biopsies were taken.   This information is not intended to replace advice given to you by your health care provider. Make sure you discuss any questions you have with your health care provider.   Document Released: 07/21/2004 Document Revised: 04/10/2014 Document Reviewed: 02/21/2012 Elsevier Interactive Patient Education [employer]2016 Elsevier Inc.

## 2015-04-19 ENCOUNTER — Encounter (HOSPITAL_COMMUNITY)
Admission: RE | Admit: 2015-04-19 | Discharge: 2015-04-19 | Disposition: A | Discharge status: Home / Self Care | Payer: Medicaid Other | Source: Ambulatory Visit | Attending: Internal Medicine | Admitting: Internal Medicine

## 2015-04-19 ENCOUNTER — Encounter (HOSPITAL_COMMUNITY): Payer: Self-pay

## 2015-04-19 DIAGNOSIS — K219 Gastro-esophageal reflux disease without esophagitis: Secondary | ICD-10-CM | POA: Insufficient documentation

## 2015-04-19 DIAGNOSIS — R1013 Epigastric pain: Secondary | ICD-10-CM | POA: Insufficient documentation

## 2015-04-19 DIAGNOSIS — Z01812 Encounter for preprocedural laboratory examination: Secondary | ICD-10-CM | POA: Insufficient documentation

## 2015-04-19 HISTORY — DX: Nausea with vomiting, unspecified: R11.2

## 2015-04-19 HISTORY — DX: Bipolar disorder, unspecified: F31.9

## 2015-04-19 HISTORY — DX: Nausea with vomiting, unspecified: Z98.890

## 2015-04-19 LAB — PREGNANCY, URINE: Preg Test, Ur: NEGATIVE

## 2015-04-19 NOTE — Pre-Procedure Instructions (Signed)
Patient given information to sign up for my chart at home. 

## 2015-04-22 ENCOUNTER — Encounter (HOSPITAL_COMMUNITY): Payer: Self-pay | Admitting: *Deleted

## 2015-04-22 ENCOUNTER — Ambulatory Visit (HOSPITAL_COMMUNITY): Payer: Medicaid Other | Admitting: Anesthesiology

## 2015-04-22 ENCOUNTER — Encounter (HOSPITAL_COMMUNITY)
Admission: RE | Disposition: A | Discharge status: Home / Self Care | Payer: Self-pay | Source: Ambulatory Visit | Attending: Internal Medicine

## 2015-04-22 ENCOUNTER — Ambulatory Visit (HOSPITAL_COMMUNITY)
Admission: RE | Admit: 2015-04-22 | Discharge: 2015-04-22 | Disposition: A | Discharge status: Home / Self Care | Payer: Medicaid Other | Source: Ambulatory Visit | Attending: Internal Medicine | Admitting: Internal Medicine

## 2015-04-22 DIAGNOSIS — K449 Diaphragmatic hernia without obstruction or gangrene: Secondary | ICD-10-CM | POA: Insufficient documentation

## 2015-04-22 DIAGNOSIS — Z885 Allergy status to narcotic agent status: Secondary | ICD-10-CM | POA: Insufficient documentation

## 2015-04-22 DIAGNOSIS — Q394 Esophageal web: Secondary | ICD-10-CM

## 2015-04-22 DIAGNOSIS — Z79899 Other long term (current) drug therapy: Secondary | ICD-10-CM | POA: Diagnosis not present

## 2015-04-22 DIAGNOSIS — F17210 Nicotine dependence, cigarettes, uncomplicated: Secondary | ICD-10-CM | POA: Diagnosis not present

## 2015-04-22 DIAGNOSIS — K222 Esophageal obstruction: Secondary | ICD-10-CM | POA: Insufficient documentation

## 2015-04-22 DIAGNOSIS — Z91040 Latex allergy status: Secondary | ICD-10-CM | POA: Insufficient documentation

## 2015-04-22 DIAGNOSIS — F17200 Nicotine dependence, unspecified, uncomplicated: Secondary | ICD-10-CM | POA: Diagnosis not present

## 2015-04-22 DIAGNOSIS — F319 Bipolar disorder, unspecified: Secondary | ICD-10-CM | POA: Diagnosis not present

## 2015-04-22 DIAGNOSIS — G4700 Insomnia, unspecified: Secondary | ICD-10-CM | POA: Diagnosis not present

## 2015-04-22 DIAGNOSIS — R1310 Dysphagia, unspecified: Secondary | ICD-10-CM

## 2015-04-22 DIAGNOSIS — F410 Panic disorder [episodic paroxysmal anxiety] without agoraphobia: Secondary | ICD-10-CM | POA: Insufficient documentation

## 2015-04-22 DIAGNOSIS — E119 Type 2 diabetes mellitus without complications: Secondary | ICD-10-CM | POA: Diagnosis not present

## 2015-04-22 DIAGNOSIS — K2100 Gastro-esophageal reflux disease with esophagitis, without bleeding: Secondary | ICD-10-CM | POA: Insufficient documentation

## 2015-04-22 DIAGNOSIS — Z9851 Tubal ligation status: Secondary | ICD-10-CM | POA: Diagnosis not present

## 2015-04-22 HISTORY — PX: MALONEY DILATION: SHX5535

## 2015-04-22 HISTORY — PX: ESOPHAGOGASTRODUODENOSCOPY (EGD) WITH PROPOFOL: SHX5813

## 2015-04-22 SURGERY — ESOPHAGOGASTRODUODENOSCOPY (EGD) WITH PROPOFOL
Anesthesia: Monitor Anesthesia Care

## 2015-04-22 MED ORDER — MIDAZOLAM HCL 2 MG/2ML IJ SOLN
INTRAMUSCULAR | Status: AC
  Filled 2015-04-22: qty 2

## 2015-04-22 MED ORDER — MIDAZOLAM HCL 2 MG/2ML IJ SOLN
1.0000 mg | INTRAMUSCULAR | Status: DC | PRN
  Administered 2015-04-22: 2 mg via INTRAVENOUS

## 2015-04-22 MED ORDER — SCOPOLAMINE 1 MG/3DAYS TD PT72
MEDICATED_PATCH | TRANSDERMAL | Status: AC
  Filled 2015-04-22: qty 1

## 2015-04-22 MED ORDER — LIDOCAINE VISCOUS 2 % MT SOLN
15.0000 mL | Freq: Once | OROMUCOSAL | Status: AC
  Administered 2015-04-22: 15 mL via OROMUCOSAL

## 2015-04-22 MED ORDER — ONDANSETRON HCL 4 MG/2ML IJ SOLN: 4.0000 mg | Freq: Once | INTRAMUSCULAR | Status: DC | PRN

## 2015-04-22 MED ORDER — LIDOCAINE HCL (CARDIAC) 10 MG/ML IV SOLN
INTRAVENOUS | Status: DC | PRN
  Administered 2015-04-22: 50 mg via INTRAVENOUS

## 2015-04-22 MED ORDER — LIDOCAINE VISCOUS 2 % MT SOLN
OROMUCOSAL | Status: AC
  Filled 2015-04-22: qty 15

## 2015-04-22 MED ORDER — MIDAZOLAM HCL 5 MG/5ML IJ SOLN
INTRAMUSCULAR | Status: DC | PRN
  Administered 2015-04-22: 2 mg via INTRAVENOUS

## 2015-04-22 MED ORDER — LACTATED RINGERS IV SOLN
INTRAVENOUS | Status: DC
  Administered 2015-04-22: 09:00:00 via INTRAVENOUS

## 2015-04-22 MED ORDER — FENTANYL CITRATE (PF) 100 MCG/2ML IJ SOLN: 25.0000 ug | INTRAMUSCULAR | Status: DC | PRN

## 2015-04-22 MED ORDER — LIDOCAINE HCL (PF) 1 % IJ SOLN
INTRAMUSCULAR | Status: AC
  Filled 2015-04-22: qty 5

## 2015-04-22 MED ORDER — PROPOFOL 10 MG/ML IV BOLUS
INTRAVENOUS | Status: AC
  Filled 2015-04-22: qty 40

## 2015-04-22 MED ORDER — SCOPOLAMINE 1 MG/3DAYS TD PT72
1.0000 | MEDICATED_PATCH | Freq: Once | TRANSDERMAL | Status: DC
  Administered 2015-04-22: 1.5 mg via TRANSDERMAL

## 2015-04-22 MED ORDER — PROPOFOL 500 MG/50ML IV EMUL
INTRAVENOUS | Status: DC | PRN
  Administered 2015-04-22: 150 ug/kg/min via INTRAVENOUS
  Administered 2015-04-22: 10:00:00 via INTRAVENOUS

## 2015-04-22 NOTE — Anesthesia Preprocedure Evaluation (Signed)
Anesthesia Evaluation  Patient identified by MRN, date of birth, ID band Patient awake    Reviewed: Allergy & Precautions, NPO status , Patient's Chart, lab work & pertinent test results  History of Anesthesia Complications (+) PONV  Airway        Dental  (+) Teeth Intact, Missing,    Pulmonary Current Smoker,    Pulmonary exam normal        Cardiovascular Normal cardiovascular exam     Neuro/Psych Seizures -, Well Controlled,  Anxiety Depression Bipolar Disorder    GI/Hepatic GERD  Medicated and Controlled,  Endo/Other  diabetes, Well Controlled, Type obesity  Renal/GU      Musculoskeletal   Abdominal (+) + obese,   Peds  Hematology   Anesthesia Other Findings   Reproductive/Obstetrics                             Anesthesia Physical Anesthesia Plan  ASA: III  Anesthesia Plan: MAC   Post-op Pain Management:    Induction:   Airway Management Planned: Nasal Cannula  Additional Equipment:   Intra-op Plan:   Post-operative Plan:   Informed Consent: I have reviewed the patients History and Physical, chart, labs and discussed the procedure including the risks, benefits and alternatives for the proposed anesthesia with the patient or authorized representative who has indicated his/her understanding and acceptance.   Dental advisory given  Plan Discussed with: CRNA  Anesthesia Plan Comments:         Anesthesia Quick Evaluation

## 2015-04-22 NOTE — Interval H&P Note (Signed)
History and Physical Interval Note:  04/22/2015 8:44 AM  Sophia Douglas  has presented today for surgery, with the diagnosis of GERD/EPIG PAIN  The various methods of treatment have been discussed with the patient and family. After consideration of risks, benefits and other options for treatment, the patient has consented to  Procedure(s) with comments: ESOPHAGOGASTRODUODENOSCOPY (EGD) WITH PROPOFOL (N/A) - 0930 MALONEY DILATION (N/A) as a surgical intervention .  The patient's history has been reviewed, patient examined, no change in status, stable for surgery.  I have reviewed the patient's chart and labs.  Questions were answered to the patient's satisfaction.     Robert Rourk  No change.   EGD with esophageal dilation as feasible/appropriate. Celiac screen negative.  The risks, benefits, limitations, alternatives and imponderables have been reviewed with the patient. Potential for esophageal dilation, biopsy, etc. have also been reviewed.  Questions have been answered. All parties agreeable.

## 2015-04-22 NOTE — H&P (View-Only) (Signed)
Primary Care Physician:  Remus Loffler, PA-C  Primary Gastroenterologist:  Roetta Sessions, MD   Chief Complaint  Patient presents with  . Gastroesophageal Reflux    HPI:  Sophia Douglas is a 30 y.o. female here for further evaluation of refractory GERD at the request of her PCP. Previously failed prilosec, protonix, OTC Nexium, Zantac, Prevacid. GERD for 9 years. Heartburn in epigastric region, heartburn. Scared to eat. Vanilla milkshake provides relief. Pill and solid food dysphagia. Odynophagia with swallowing. Weight loss of 7 pounds in past two weeks. Decreased eating. IBS medication in high school. Either constipation and diarrhea. Constipation associated with bloating and abdominal pain. Has had just diarrhea for past 3 weeks, urgency, postprandial 5+ stools per day. Some nocturnal symptoms. Recently took levaquin for bronchitis.   No prior egd/tcs.   Current Outpatient Prescriptions  Medication Sig Dispense Refill  . ALPRAZolam (XANAX) 0.5 MG tablet Take 0.5 mg by mouth 3 (three) times daily as needed for anxiety.    Marland Kitchen FLUCONAZOLE PO Take by mouth as needed.    Marland Kitchen LAMICTAL 100 MG tablet One and 1/2 tabs po bid 90 tablet 11  . QUEtiapine (SEROQUEL) 200 MG tablet Take 200 mg by mouth at bedtime.    . rizatriptan (MAXALT-MLT) 5 MG disintegrating tablet Take 1 tablet (5 mg total) by mouth as needed. May repeat in 2 hours if needed 12 tablet 6  . sertraline (ZOLOFT) 100 MG tablet Take 100 mg by mouth daily.    Marland Kitchen topiramate (TOPAMAX) 100 MG tablet Take 200 mg by mouth at bedtime.     No current facility-administered medications for this visit.    Allergies as of 03/25/2015 - Review Complete 03/25/2015  Allergen Reaction Noted  . Latex Rash 12/05/2012  . Morphine and related Swelling and Rash 10/11/2011    Past Medical History  Diagnosis Date  . Gestational diabetes   . Numbness     Left arm and left leg  . GERD (gastroesophageal reflux disease)   . Mood disorder (HCC)   .  Insomnia   . Panic disorder   . Anxiety disorder   . Seizures [facility])     Past Surgical History  Procedure Laterality Date  . Cesarean section    . Tonsillectomy    . Tubal ligation      Family History  Problem Relation Age of Onset  . Diabetes Mother   . Diabetes Maternal Grandmother   . Hypercholesterolemia Father   . Hypertension Father   . Neuropathy Father   . Heart disease Maternal Grandfather   . Kidney disease Maternal Grandmother   . Colon cancer Neg Hx   . Inflammatory bowel disease Neg Hx     Social History   Social History  . Marital Status: Single    Spouse Name: N/A  . Number of Children: 2  . Years of Education: 12   Occupational History  . Unemployed    Social History Main Topics  . Smoking status: Current Some Day Smoker -- 1.00 packs/day    Types: Cigarettes  . Smokeless tobacco: Never Used  . Alcohol Use: No  . Drug Use: No  . Sexual Activity: Yes    Birth Control/ Protection: IUD   Other Topics Concern  . Not on file   Social History Narrative   Lives at home with her boyfriend and two children (son and daughter). Patient is unemployed.   Right-handed.   Occasional caffeine use.      ROS:  General:  Negative for anorexia, weight loss, fever, chills, fatigue, weakness. Eyes: Negative for vision changes.  ENT: Negative for hoarseness, difficulty swallowing , nasal congestion. CV: Negative for chest pain, angina, palpitations, dyspnea on exertion, peripheral edema.  Respiratory: Negative for dyspnea at rest, dyspnea on exertion, cough, sputum, wheezing.  GI: See history of present illness. GU:  Negative for dysuria, hematuria, urinary incontinence, urinary frequency, nocturnal urination.  MS: Negative for joint pain, low back pain.  Derm: Negative for rash or itching.  Neuro: Negative for weakness, abnormal sensation, seizure, frequent headaches, memory loss, confusion.  Psych: Negative for anxiety, depression, suicidal ideation,  hallucinations.  Endo: Negative for unusual weight change.  Heme: Negative for bruising or bleeding. Allergy: Negative for rash or hives.    Physical Examination:  BP 124/72 mmHg  Pulse 93  Temp(Src) 97.3 F (36.3 C)  Ht 5\' 2"  (1.575 m)  Wt 208 lb 6.4 oz (94.53 kg)  BMI 38.11 kg/m2  LMP 03/19/2015   General: Well-nourished, well-developed in no acute distress.  Head: Normocephalic, atraumatic.   Eyes: Conjunctiva pink, no icterus. Mouth: Oropharyngeal mucosa moist and pink , no lesions erythema or exudate. Neck: Supple without thyromegaly, masses, or lymphadenopathy.  Lungs: Clear to auscultation bilaterally.  Heart: Regular rate and rhythm, no murmurs rubs or gallops.  Abdomen: Bowel sounds are normal, nontender, nondistended, no hepatosplenomegaly or masses, no abdominal bruits or    hernia , no rebound or guarding.   Rectal: not performed Extremities: No lower extremity edema. No clubbing or deformities.  Neuro: Alert and oriented x 4 , grossly normal neurologically.  Skin: Warm and dry, no rash or jaundice.   Psych: Alert and cooperative, normal mood and affect.  Labs: Lab Results  Component Value Date   WBC 8.6 12/17/2014   HGB 13.8 10/11/2011   HCT 39.6 12/17/2014   MCV 89.8 10/11/2011   PLT 162 10/11/2011   Lab Results  Component Value Date   ALT 17 12/17/2014   AST 13 12/17/2014   ALKPHOS 68 12/17/2014   BILITOT <0.2 12/17/2014   Lab Results  Component Value Date   CREATININE 0.93 12/17/2014   BUN 14 12/17/2014   NA 143 12/17/2014   K 4.0 12/17/2014   CL 105 12/17/2014   CO2 20 12/17/2014     Imaging Studies: No results found.

## 2015-04-22 NOTE — Anesthesia Postprocedure Evaluation (Signed)
Anesthesia Post Note  Patient: Sophia Douglas  Procedure(s) Performed: Procedure(s) (LRB): ESOPHAGOGASTRODUODENOSCOPY (EGD) WITH PROPOFOL (N/Douglas) MALONEY DILATION (N/Douglas)  Patient location during evaluation: PACU Anesthesia Type: MAC Level of consciousness: awake and alert and oriented Pain management: pain level not controlled Vital Signs Assessment: post-procedure vital signs reviewed and stable Respiratory status: spontaneous breathing and respiratory function stable Cardiovascular status: stable Postop Assessment: no signs of nausea or vomiting Anesthetic complications: no    Last Vitals:  Filed Vitals:   04/22/15 0840  BP: 116/76  Temp: 36.8 C    Last Pain: There were no vitals filed for this visit.               Sophia Douglas

## 2015-04-22 NOTE — Transfer of Care (Signed)
Immediate Anesthesia Transfer of Care Note  Patient: Sophia Douglas  Procedure(s) Performed: Procedure(s) with comments: ESOPHAGOGASTRODUODENOSCOPY (EGD) WITH PROPOFOL (N/A) - 0930 MALONEY DILATION (N/A) - 54  Patient Location: PACU  Anesthesia Type:MAC  Level of Consciousness: awake, alert , oriented and patient cooperative  Airway & Oxygen Therapy: Patient Spontanous Breathing and Patient connected to face mask oxygen  Post-op Assessment: Report given to RN and Post -op Vital signs reviewed and stable  Post vital signs: Reviewed and stable  Last Vitals:  Filed Vitals:   04/22/15 0840  BP: 116/76  Temp: 36.8 C    Complications: No apparent anesthesia complications

## 2015-04-22 NOTE — Discharge Instructions (Addendum)
EGD Discharge instructions Please read the instructions outlined below and refer to this sheet in the next few weeks. These discharge instructions provide you with general information on caring for yourself after you leave the hospital. Your doctor may also give you specific instructions. While your treatment has been planned according to the most current medical practices available, unavoidable complications occasionally occur. If you have any problems or questions after discharge, please call your doctor. ACTIVITY  You may resume your regular activity but move at a slower pace for the next 24 hours.   Take frequent rest periods for the next 24 hours.   Walking will help expel (get rid of) the air and reduce the bloated feeling in your abdomen.   No driving for 24 hours (because of the anesthesia (medicine) used during the test).   You may shower.   Do not sign any important legal documents or operate any machinery for 24 hours (because of the anesthesia used during the test).  NUTRITION  Drink plenty of fluids.   You may resume your normal diet.   Begin with a light meal and progress to your normal diet.   Avoid alcoholic beverages for 24 hours or as instructed by your caregiver.  MEDICATIONS  You may resume your normal medications unless your caregiver tells you otherwise.  WHAT YOU CAN EXPECT TODAY  You may experience abdominal discomfort such as a feeling of fullness or gas pains.  FOLLOW-UP  Your doctor will discuss the results of your test with you.  SEEK IMMEDIATE MEDICAL ATTENTION IF ANY OF THE FOLLOWING OCCUR:  Excessive nausea (feeling sick to your stomach) and/or vomiting.   Severe abdominal pain and distention (swelling).   Trouble swallowing.   Temperature over 101 F (37.8 C).   Rectal bleeding or vomiting of blood.     GERD information provided  Continue Dexilant 60 mg daily  Office visit with [place] in 3 months  Esophagogastroduodenoscopy, Care  After Refer to this sheet in the next few weeks. These instructions provide you with information about caring for yourself after your procedure. Your health care provider may also give you more specific instructions. Your treatment has been planned according to current medical practices, but problems sometimes occur. Call your health care provider if you have any problems or questions after your procedure. WHAT TO EXPECT AFTER THE PROCEDURE After your procedure, it is typical to feel:  Soreness in your throat.  Pain with swallowing.  Sick to your stomach (nauseous).  Bloated.  Dizzy.  Fatigued. HOME CARE INSTRUCTIONS  Do not eat or drink anything until the numbing medicine (local anesthetic) has worn off and your gag reflex has returned. You will know that the local anesthetic has worn off when you can swallow comfortably.  Do not drive or operate machinery until directed by your health care provider.  Take medicines only as directed by your health care provider. SEEK MEDICAL CARE IF:   You cannot stop coughing.  You are not urinating at all or less than usual. SEEK IMMEDIATE MEDICAL CARE IF:  You have difficulty swallowing.  You cannot eat or drink.  You have worsening throat or chest pain.  You have dizziness or lightheadedness or you faint.  You have nausea or vomiting.  You have chills.  You have a fever.  You have severe abdominal pain.  You have black, tarry, or bloody stools.   This information is not intended to replace advice given to you by your health care provider. Make  sure you discuss any questions you have with your health care provider.   Document Released: 03/06/2012 Document Revised: 04/10/2014 Document Reviewed: 03/06/2012 Elsevier Interactive Patient Education 2016 Elsevier Inc. Gastroesophageal Reflux Disease, Adult Normally, food travels down the esophagus and stays in the stomach to be digested. If a person has gastroesophageal reflux  disease (GERD), food and stomach acid move back up into the esophagus. When this happens, the esophagus becomes sore and swollen (inflamed). Over time, GERD can make small holes (ulcers) in the lining of the esophagus. HOME CARE Diet  Follow a diet as told by your doctor. You may need to avoid foods and drinks such as:  Coffee and tea (with or without caffeine).  Drinks that contain alcohol.  Energy drinks and sports drinks.  Carbonated drinks or sodas.  Chocolate and cocoa.  Peppermint and mint flavorings.  Garlic and onions.  Horseradish.  Spicy and acidic foods, such as peppers, chili powder, curry powder, vinegar, hot sauces, and BBQ sauce.  Citrus fruit juices and citrus fruits, such as oranges, lemons, and limes.  Tomato-based foods, such as red sauce, chili, salsa, and pizza with red sauce.  Fried and fatty foods, such as donuts, french fries, potato chips, and high-fat dressings.  High-fat meats, such as hot dogs, rib eye steak, sausage, ham, and bacon.  High-fat dairy items, such as whole milk, butter, and cream cheese.  Eat small meals often. Avoid eating large meals.  Avoid drinking large amounts of liquid with your meals.  Avoid eating meals during the 2-3 hours before bedtime.  Avoid lying down right after you eat.  Do not exercise right after you eat. General Instructions  Pay attention to any changes in your symptoms.  Take over-the-counter and prescription medicines only as told by your doctor. Do not take aspirin, ibuprofen, or other NSAIDs unless your doctor says it is okay.  Do not use any tobacco products, including cigarettes, chewing tobacco, and e-cigarettes. If you need help quitting, ask your doctor.  Wear loose clothes. Do not wear anything tight around your waist.  Raise (elevate) the head of your bed about 6 inches (15 cm).  Try to lower your stress. If you need help doing this, ask your doctor.  If you are overweight, lose an  amount of weight that is healthy for you. Ask your doctor about a safe weight loss goal.  Keep all follow-up visits as told by your doctor. This is important. GET HELP IF:  You have new symptoms.  You lose weight and you do not know why it is happening.  You have trouble swallowing, or it hurts to swallow.  You have wheezing or a cough that keeps happening.  Your symptoms do not get better with treatment.  You have a hoarse voice. GET HELP RIGHT AWAY IF:  You have pain in your arms, neck, jaw, teeth, or back.  You feel sweaty, dizzy, or light-headed.  You have chest pain or shortness of breath.  You throw up (vomit) and your throw up looks like blood or coffee grounds.  You pass out (faint).  Your poop (stool) is bloody or black.  You cannot swallow, drink, or eat.   This information is not intended to replace advice given to you by your health care provider. Make sure you discuss any questions you have with your health care provider.   Document Released: 09/06/2007 Document Revised: 12/09/2014 Document Reviewed: 07/15/2014 Elsevier Interactive Patient Education 2016 [employer]. Food Choices for Gastroesophageal Reflux Disease, Adult  When you have gastroesophageal reflux disease (GERD), the foods you eat and your eating habits are very important. Choosing the right foods can help ease the discomfort of GERD. WHAT GENERAL GUIDELINES DO I NEED TO FOLLOW?  Choose fruits, vegetables, whole grains, low-fat dairy products, and low-fat meat, fish, and poultry.  Limit fats such as oils, salad dressings, butter, nuts, and avocado.  Keep a food diary to identify foods that cause symptoms.  Avoid foods that cause reflux. These may be different for different people.  Eat frequent small meals instead of three large meals each day.  Eat your meals slowly, in a relaxed setting.  Limit fried foods.  Cook foods using methods other than frying.  Avoid drinking  alcohol.  Avoid drinking large amounts of liquids with your meals.  Avoid bending over or lying down until 2-3 hours after eating. WHAT FOODS ARE NOT RECOMMENDED? The following are some foods and drinks that may worsen your symptoms: Vegetables Tomatoes. Tomato juice. Tomato and spaghetti sauce. Chili peppers. Onion and garlic. Horseradish. Fruits Oranges, grapefruit, and lemon (fruit and juice). Meats High-fat meats, fish, and poultry. This includes hot dogs, ribs, ham, sausage, salami, and bacon. Dairy Whole milk and chocolate milk. Sour cream. Cream. Butter. Ice cream. Cream cheese.  Beverages Coffee and tea, with or without caffeine. Carbonated beverages or energy drinks. Condiments Hot sauce. Barbecue sauce.  Sweets/Desserts Chocolate and cocoa. Donuts. Peppermint and spearmint. Fats and Oils High-fat foods, including [place] fries and potato chips. Other Vinegar. Strong spices, such as black pepper, white pepper, red pepper, cayenne, curry powder, cloves, ginger, and chili powder. The items listed above may not be a complete list of foods and beverages to avoid. Contact your dietitian for more information.   This information is not intended to replace advice given to you by your health care provider. Make sure you discuss any questions you have with your health care provider.   Document Released: 03/20/2005 Document Revised: 04/10/2014 Document Reviewed: 01/22/2013 Elsevier Interactive Patient Education 2016 Elsevier Inc. Monitored Anesthesia Care Monitored anesthesia care is an anesthesia service for a medical procedure. Anesthesia is the loss of the ability to feel pain. It is produced by medicines called anesthetics. It may affect a small area of your body (local anesthesia), a large area of your body (regional anesthesia), or your entire body (general anesthesia). The need for monitored anesthesia care depends your procedure, your condition, and the potential need for  regional or general anesthesia. It is often provided during procedures where:   General anesthesia may be needed if there are complications. This is because you need special care when you are under general anesthesia.   You will be under local or regional anesthesia. This is so that you are able to have higher levels of anesthesia if needed.   You will receive calming medicines (sedatives). This is especially the case if sedatives are given to put you in a semi-conscious state of relaxation (deep sedation). This is because the amount of sedative needed to produce this state can be hard to predict. Too much of a sedative can produce general anesthesia. Monitored anesthesia care is performed by one or more health care providers who have special training in all types of anesthesia. You will need to meet with these health care providers before your procedure. During this meeting, they will ask you about your medical history. They will also give you instructions to follow. (For example, you will need to stop eating and drinking before your procedure.  You may also need to stop or change medicines you are taking.) During your procedure, your health care providers will stay with you. They will:   Watch your condition. This includes watching your blood pressure, breathing, and level of pain.   Diagnose and treat problems that occur.   Give medicines if they are needed. These may include calming medicines (sedatives) and anesthetics.   Make sure you are comfortable.  Having monitored anesthesia care does not necessarily mean that you will be under anesthesia. It does mean that your health care providers will be able to manage anesthesia if you need it or if it occurs. It also means that you will be able to have a different type of anesthesia than you are having if you need it. When your procedure is complete, your health care providers will continue to watch your condition. They will make sure any  medicines wear off before you are allowed to go home.    This information is not intended to replace advice given to you by your health care provider. Make sure you discuss any questions you have with your health care provider.   Document Released: 12/14/2004 Document Revised: 04/10/2014 Document Reviewed: 05/01/2012 Elsevier Interactive Patient Education 2016 Elsevier Inc. Dexlansoprazole capsules What is this medicine? DEXLANSOPRAZOLE (dex lan SOE pra zole) prevents the production of acid in the stomach. It is used to treat gastroesophageal reflux disease (GERD) and inflammation of the esophagus. This medicine may be used for other purposes; ask your health care provider or pharmacist if you have questions. What should I tell my health care provider before I take this medicine? They need to know if you have any of these conditions: -liver disease -low levels of magnesium in the blood -an unusual or allergic reaction to dexlansoprazole, other medicines, foods, dyes, or preservatives -pregnant or trying to get pregnant -breast-feeding How should I use this medicine? Take this medicine by mouth. Swallow the capsules whole with a drink of water. Follow the directions on the prescription label. Do not crush or chew. Take your medicine at regular intervals. Do not take more often than directed. If you have difficulty swallowing the capsules, you may open the capsule and sprinkle the contents on a tablespoon of applesauce. Do not crush the contents of the capsule into the food. Swallow the dose immediately after preparing it. Do not chew. Follow with a drink of water. A special MedGuide will be given to you before each treatment. Be sure to read this information carefully each time. Talk to your pediatrician regarding the use of this medicine in children. While this drug may be prescribed for children as young as 12 years for selected conditions, precautions do apply. Overdosage: If you think you  have taken too much of this medicine contact a poison control center or emergency room at once. NOTE: This medicine is only for you. Do not share this medicine with others. What if I miss a dose? If you miss a dose, take it as soon as you can. If it is almost time for your next dose, take only that dose. Do not take double or extra doses. What may interact with this medicine? Do not take this medicine with any of the following medications: -nelfinavir -rilpivirine -St. John's Wort This medicine may also interact with the following medications: -certain antiviral medicines for HIV or AIDS like atazanavir, saquinavir, ritonavir -certain medicines for fungal infections like ketoconazole, itraconazole, voriconazole -dasatinib -digoxin -erlotinib -iron salts -methotrexate -mycophenolate mofetil -nilotinib -tacrolimus -warfarin  This list may not describe all possible interactions. Give your health care provider a list of all the medicines, herbs, non-prescription drugs, or dietary supplements you use. Also tell them if you smoke, drink alcohol, or use illegal drugs. Some items may interact with your medicine. What should I watch for while using this medicine? It can take several days before your stomach pain gets better. Check with your doctor or health care professional if your condition does not start to get better, or if it gets worse. You may need blood work done while you are taking this medicine. What side effects may I notice from receiving this medicine? Side effects that you should report to your doctor or health care professional as soon as possible: -allergic reactions like skin rash, itching or hives, swelling of the face, lips, or tongue -bone, muscle or joint pain -breathing problems -chest pain or chest tightness -dark yellow or brown urine -dizziness -fast, irregular heartbeat -feeling faint or lightheaded -fever or sore throat -muscle spasm -palpitations -redness,  blistering, peeling or loosening of the skin, including inside the mouth -seizures -tremors -unusual bleeding or bruising -unusually weak or tired -yellowing of the eyes or skin Side effects that usually do not require medical attention (Report these to your doctor or health care professional if they continue or are bothersome.): -constipation -diarrhea -dry mouth -headache -nausea This list may not describe all possible side effects. Call your doctor for medical advice about side effects. You may report side effects to FDA at 1-800-FDA-1088. Where should I keep my medicine? Keep out of the reach of children. Store at room temperature between 15 and 30 degrees C (59 and 86 degrees F). Protect from moisture. Throw away any unused medicine after the expiration date. NOTE: This sheet is a summary. It may not cover all possible information. If you have questions about this medicine, talk to your doctor, pharmacist, or health care provider.    2016, Elsevier/Gold Standard. (2014-10-15 19:13:22)

## 2015-04-22 NOTE — Op Note (Signed)
[facility] [address] [place], [address]   ENDOSCOPY PROCEDURE REPORT  PATIENT: Sophia, Douglas  MR#: [id number] BIRTHDATE: [birth year] , 29  yrs. old GENDER: female ENDOSCOPIST: R.  Roetta Sessions, MD [facility] REFERRED BY:  Prudy Feeler, PA-C PROCEDURE DATE:  2015/05/21 PROCEDURE:  Elease Hashimoto dilation of esophagus INDICATIONS:  Esophageal dysphagia; GERD.  GERD symptoms much better with a one-week course of Dexilant. MEDICATIONS: Deep sedation per Dr.  [facility] CLASS:      Class II  CONSENT: The risks, benefits, limitations, alternatives and imponderables have been discussed.  The potential for biopsy, esophogeal dilation, etc. have also been reviewed.  Questions have been answered.  All parties agreeable.  Please see the history and physical in the medical record for more information.  DESCRIPTION OF PROCEDURE: After the risks benefits and alternatives of the procedure were thoroughly explained, informed consent was obtained.  The EG-[id number] ([id number]) endoscope was introduced through the mouth and advanced to the second portion of the duodenum , limited by Without limitations. The instrument was slowly withdrawn as the mucosa was fully examined. Estimated blood loss is zero unless otherwise noted in this procedure report.    Prominent Schatzki's ring.  Couple tiny distal esophageal erosions present.  No Barrett's epithelium.  No tumor.  Stomach empty.  2 cm hiatal hernia.  Normal-appearing gastric mucosa.  Patent pylorus. Normal-appearing for second third portion of the duodenum.  Scope was withdrawn and a 54 [place] Maloney dilator was passed to full insertion.  A look back revealed the ring remained intact.  I did not feel her hypopharynx would accommodate a larger bore dilator.  Consequently I took (2) quadrant "bites" of the ring to disrupt it with the biopsy forceps.  This was done without difficulty or apparent complication.   Retroflexed views revealed a hiatal hernia.     The scope was then withdrawn from the patient and the procedure completed.  COMPLICATIONS: There were no immediate complications. EBL 3 mL ENDOSCOPIC IMPRESSION: Mild erosive reflux esophagitis. Schatzkis ring?"status post dilation and disruption as described above. Hiatal hernia.  RECOMMENDATIONS: Continue Dexilant 60 mg daily. Office follow up with [place] in 3 months.  REPEAT EXAM:  eSigned:  R. Roetta Sessions, MD Jerrel Ivory [facility] 21-May-2015 10:28 AM    CC:  CPT CODES: ICD CODES:  The ICD and CPT codes recommended by this software are interpretations from the data that the clinical staff has captured with the software.  The verification of the translation of this report to the ICD and CPT codes and modifiers is the sole responsibility of the health care institution and practicing physician where this report was generated.  [employer] will not be held responsible for the validity of the ICD and CPT codes included on this report.  AMA assumes no liability for data contained or not contained herein. CPT is a Publishing rights manager of the [employer].  PATIENT NAME:  Sophia, Douglas MR#: [id number]

## 2015-04-27 ENCOUNTER — Encounter (HOSPITAL_COMMUNITY): Payer: Self-pay | Admitting: Internal Medicine

## 2015-06-23 ENCOUNTER — Encounter: Payer: Self-pay | Admitting: Nurse Practitioner

## 2015-06-23 ENCOUNTER — Ambulatory Visit (INDEPENDENT_AMBULATORY_CARE_PROVIDER_SITE_OTHER): Payer: Medicaid Other | Admitting: Nurse Practitioner

## 2015-06-23 VITALS — BP 108/74 | HR 80 | Ht 62.0 in | Wt 218.0 lb

## 2015-06-23 DIAGNOSIS — G40909 Epilepsy, unspecified, not intractable, without status epilepticus: Secondary | ICD-10-CM | POA: Diagnosis not present

## 2015-06-23 DIAGNOSIS — G43909 Migraine, unspecified, not intractable, without status migrainosus: Secondary | ICD-10-CM | POA: Diagnosis not present

## 2015-06-23 MED ORDER — TOPIRAMATE 100 MG PO TABS: 200.0000 mg | ORAL_TABLET | Freq: Every day | ORAL | Status: DC

## 2015-06-23 NOTE — Progress Notes (Signed)
I have reviewed and agreed above plan. 

## 2015-06-23 NOTE — Patient Instructions (Signed)
Continue Lamictal at current dose  Continue Topamax at current dose Use Maxalt acutely Call for seizure activity F/U yearly and prn
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
## 2015-07-20 ENCOUNTER — Encounter: Payer: Self-pay | Admitting: Internal Medicine

## 2016-02-16 ENCOUNTER — Ambulatory Visit: Payer: Medicaid Other | Admitting: Gastroenterology

## 2016-02-17 ENCOUNTER — Ambulatory Visit (INDEPENDENT_AMBULATORY_CARE_PROVIDER_SITE_OTHER): Payer: Medicaid Other | Admitting: Gastroenterology

## 2016-02-17 ENCOUNTER — Encounter: Payer: Self-pay | Admitting: Gastroenterology

## 2016-02-17 VITALS — BP 128/70 | HR 71 | Temp 97.1°F | Ht 62.0 in | Wt 202.8 lb

## 2016-02-17 DIAGNOSIS — K580 Irritable bowel syndrome with diarrhea: Secondary | ICD-10-CM | POA: Diagnosis not present

## 2016-02-17 DIAGNOSIS — K2100 Gastro-esophageal reflux disease with esophagitis, without bleeding: Secondary | ICD-10-CM

## 2016-02-17 DIAGNOSIS — K589 Irritable bowel syndrome without diarrhea: Secondary | ICD-10-CM | POA: Insufficient documentation

## 2016-02-17 MED ORDER — SUCRALFATE 1 GM/10ML PO SUSP: 1.0000 g | Freq: Four times a day (QID) | ORAL | 1 refills | Status: DC

## 2016-02-17 MED ORDER — ELUXADOLINE 75 MG PO TABS: 1.0000 | ORAL_TABLET | Freq: Two times a day (BID) | ORAL | 3 refills | Status: DC

## 2016-02-17 NOTE — Patient Instructions (Signed)
For diarrhea: Start taking Viberzi 1 tablet with food once a day for 3 days, then increase to twice a day if needed. Stop medication immediately if severe upper abdominal pain, severe nausea and vomiting. I have given you samples. Stop Bentyl.   For reflux:  Continue Dexilant once a day in the morning and Zantac at night. I sent in Carafate liquid to take 4 times a day before meals.   We have ordered a special xray of your esophagus to further evaluation what is going on.   You may ultimately need an upper endoscopy.

## 2016-02-17 NOTE — Progress Notes (Signed)
Referring Provider: Remus LofflerJones, Angel S, PA-C Primary Care Physician:  Remus LofflerAngel S Jones, PA-C Primary GI: Dr. Jena Gaussourk   Chief Complaint  Patient presents with  . Dysphagia    HPI:   Sophia Douglas is a 30 y.o. female presenting today with a history of GERD, dysphagia s/p dilation of Schatzkis ring Jan 2017, IBS. Returns in follow-up with recurrent dysphagia.   Notes improvement with dilation earlier this year. Reflux waking her up in the middle of the night. Has increased Dexilant to BID. Stomach always burning. Notes recurrent dysphagia. Trying to avoid anything that triggers GERD symptoms. Sometimes doesn't eat because fear of reflux. Denies eating late at night. Drinks milk to try and soothe reflux. Vanilla milkshake will ease it for a little. Water gives heartburn. No NSAIDs. Previously tried/failed Prilosec, Protonix, Zantac, Nexium.   IBS: never has a solid stool. Doesn't feel like it has helped. Always Bristol stool scale #6-7. Goes about 4-5 times per day. No rectal bleeding. Has chronic right-sided abdominal discomfort associated with bowel habits. Present since middle school.   Past Medical History:  Diagnosis Date  . Anxiety disorder   . Bipolar disorder (HCC)   . GERD (gastroesophageal reflux disease)   . Gestational diabetes   . Insomnia   . Mood disorder (HCC)   . Numbness    Left arm and left leg  . Panic disorder   . PONV (postoperative nausea and vomiting)   . Seizures (HCC)    dx in 09/2014.Ascencion Dike. unlnown etiolgy. ON meds; last seizure was 10/29/2014.    Past Surgical History:  Procedure Laterality Date  . CESAREAN SECTION    . ESOPHAGOGASTRODUODENOSCOPY (EGD) WITH PROPOFOL N/A 04/22/2015   Dr. Jena Gaussourk: mild erosive reflux esophagiits, Schatzki's ring s/p dilation, hiatal hernia  . MALONEY DILATION N/A 04/22/2015   Procedure: Elease HashimotoMALONEY DILATION;  Surgeon: Corbin Adeobert M Rourk, MD;  [facility]: [facility];  Service: Endoscopy;  Laterality: N/A;  54  . TONSILLECTOMY    . TUBAL  LIGATION      Current Outpatient Prescriptions  Medication Sig Dispense Refill  . ALPRAZolam (XANAX) 0.5 MG tablet Take 0.5 mg by mouth 3 (three) times daily as needed for anxiety.    Marland Kitchen. dexlansoprazole (DEXILANT) 60 MG capsule Take 1 capsule (60 mg total) by mouth daily. 30 capsule 11  . dicyclomine (BENTYL) 10 MG capsule Take 1 capsule (10 mg total) by mouth 4 (four) times daily -  before meals and at bedtime. As needed for cramps and diarrhea. 120 capsule 1  . FLUCONAZOLE PO Take 1 tablet by mouth as needed (yeast infections).     Marland Kitchen. LAMICTAL 100 MG tablet One and 1/2 tabs po bid 90 tablet 11  . rizatriptan (MAXALT-MLT) 5 MG disintegrating tablet Take 1 tablet (5 mg total) by mouth as needed. May repeat in 2 hours if needed 12 tablet 6  . sertraline (ZOLOFT) 100 MG tablet Take 100 mg by mouth daily.    Marland Kitchen. topiramate (TOPAMAX) 100 MG tablet Take 2 tablets (200 mg total) by mouth at bedtime. 60 tablet 11  . zolpidem (AMBIEN) 10 MG tablet Take 10 mg by mouth at bedtime as needed for sleep.     No current facility-administered medications for this visit.     Allergies as of 02/17/2016 - Review Complete 02/17/2016  Allergen Reaction Noted  . Latex Rash 12/05/2012  . Morphine and related Swelling and Rash 10/11/2011    Family History  Problem Relation Age of Onset  .  Diabetes Mother   . Hypercholesterolemia Father   . Hypertension Father   . Neuropathy Father   . Cancer Paternal Grandfather     rectal  . Diabetes Maternal Grandmother   . Kidney disease Maternal Grandmother   . Heart disease Maternal Grandfather   . Colon cancer Neg Hx   . Inflammatory bowel disease Neg Hx     Social History   Social History  . Marital status: Single    Spouse name: N/A  . Number of children: 2  . Years of education: 12   Occupational History  . Unemployed    Social History Main Topics  . Smoking status: Current Every Day Smoker    Packs/day: 1.00    Years: 10.00    Types: Cigarettes    . Smokeless tobacco: Never Used  . Alcohol use No     Comment: social  . Drug use: No  . Sexual activity: Yes    Birth control/ protection: Surgical   Other Topics Concern  . Not on file   Social History Narrative   Lives at home with her boyfriend and two children (son and daughter). Patient is unemployed.   Right-handed.   Occasional caffeine use.    Review of Systems: As mentioned in HPI   Physical Exam: BP 128/70   Pulse 71   Temp 97.1 F (36.2 C) (Oral)   Ht 5\' 2"  (1.575 m)   Wt 202 lb 12.8 oz (92 kg)   BMI 37.09 kg/m  General:   Alert and oriented. No distress noted. Pleasant and cooperative.  Head:  Normocephalic and atraumatic. Eyes:  Conjuctiva clear without scleral icterus. Mouth:  Oral mucosa pink and moist. Good dentition. No lesions. Heart:  S1, S2 present without murmurs, rubs, or gallops. Regular rate and rhythm. Abdomen:  +BS, soft, mild TTP entire right sided abdome and non-distended. No rebound or guarding. No HSM or masses noted. Msk:  Symmetrical without gross deformities. Normal posture. Extremities:  Without edema. Neurologic:  Alert and  oriented x4;  grossly normal neurologically. Psych:  Alert and cooperative. Normal mood and affect.  Lab Results  Component Value Date   WBC 10.1 03/31/2015   HGB 13.2 03/31/2015   HCT 39.9 03/31/2015   MCV 88.3 03/31/2015   PLT 213 03/31/2015   Lab Results  Component Value Date   ALT 15 03/31/2015   AST 14 03/31/2015   ALKPHOS 73 03/31/2015   BILITOT 0.2 03/31/2015   Lab Results  Component Value Date   CREATININE 0.92 03/31/2015   BUN 14 03/31/2015   NA 141 03/31/2015   K 3.8 03/31/2015   CL 109 03/31/2015   CO2 23 03/31/2015

## 2016-02-17 NOTE — Assessment & Plan Note (Signed)
No improvement with Bentyl. No alarm features. Likely chronic right-sided abdominal pain (present since middle school) is secondary to IBS. Prior evaluation less than a year ago with normal LFTs, CBC, lipase, all performed due to chronic symptoms that she still has. No need for imaging at this time. Trial Viberzi 75 mg po once daily with food, increase to BID if tolerated. Discussed potential low risk of pancreatitis, discussed prior studies both published and post-market data. No ETOH, prior history of pancreatitis, and gallbladder remains in situ. Proceed with Viberzi now and monitor for any adverse effects. Patient instructed to call if any issues.

## 2016-02-17 NOTE — Assessment & Plan Note (Signed)
30 year old female with GERD exacerbation despite Dexilant BID. Has employed dietary and behavior modification. Recurrent dysphagia noted. EGD completed Jan 2017 with reflux esophagitis, Schatzki's ring s/p dilation. Continue Dexilant only once daily, add Zantac at night, add Carafate. Proceed with BPE for further evaluation. May ultimately need repeat EGD.

## 2016-02-18 ENCOUNTER — Telehealth: Payer: Self-pay | Admitting: Physician Assistant

## 2016-02-18 NOTE — Progress Notes (Signed)
CC'ED TO PCP 

## 2016-03-02 ENCOUNTER — Ambulatory Visit (HOSPITAL_COMMUNITY)
Admission: RE | Admit: 2016-03-02 | Discharge: 2016-03-02 | Disposition: A | Discharge status: Home / Self Care | Payer: Medicaid Other | Source: Ambulatory Visit | Attending: Gastroenterology | Admitting: Gastroenterology

## 2016-03-02 DIAGNOSIS — K2100 Gastro-esophageal reflux disease with esophagitis, without bleeding: Secondary | ICD-10-CM

## 2016-03-02 NOTE — Progress Notes (Signed)
BPE is completely normal. How is she doing with Carafate?

## 2016-03-14 NOTE — Progress Notes (Signed)
Good. Let's see her in 3 months.

## 2016-03-15 ENCOUNTER — Encounter: Payer: Self-pay | Admitting: Internal Medicine

## 2016-03-20 ENCOUNTER — Ambulatory Visit: Payer: Medicaid Other

## 2016-04-03 HISTORY — PX: ABDOMINAL HYSTERECTOMY: SHX81

## 2016-04-18 ENCOUNTER — Telehealth: Payer: Self-pay

## 2016-04-18 NOTE — Telephone Encounter (Signed)
Staying with Dr. Charm BargesButler, last I knew.

## 2016-04-24 ENCOUNTER — Telehealth: Payer: Self-pay

## 2016-04-24 MED ORDER — DEXLANSOPRAZOLE 60 MG PO CPDR: 60.0000 mg | DELAYED_RELEASE_CAPSULE | Freq: Every day | ORAL | 11 refills | Status: DC

## 2016-04-24 NOTE — Telephone Encounter (Signed)
Pt is aware.  

## 2016-04-24 NOTE — Telephone Encounter (Signed)
Rx sent, please notify patient

## 2016-04-24 NOTE — Telephone Encounter (Signed)
Pt needs refill of Dexilant [facility].

## 2016-04-27 ENCOUNTER — Telehealth: Payer: Self-pay | Admitting: Internal Medicine

## 2016-04-27 NOTE — Telephone Encounter (Signed)
Pt called to follow up on her PA of dexilant. I transferred call to JL VM

## 2016-04-27 NOTE — Telephone Encounter (Signed)
PA is done and approved. Pharmacy did not notify us that it was needed. I spoke with the pt and she is aware.

## 2016-05-05 ENCOUNTER — Other Ambulatory Visit: Payer: Self-pay | Admitting: *Deleted

## 2016-06-13 ENCOUNTER — Encounter: Payer: Self-pay | Admitting: Gastroenterology

## 2016-06-13 ENCOUNTER — Telehealth: Payer: Self-pay | Admitting: Gastroenterology

## 2016-06-13 ENCOUNTER — Ambulatory Visit: Payer: Medicaid Other | Admitting: Gastroenterology

## 2016-06-13 NOTE — Telephone Encounter (Signed)
PATIENT WAS A NO SHOW AND LETTER SENT  °

## 2016-06-22 ENCOUNTER — Ambulatory Visit: Payer: Medicaid Other | Admitting: Nurse Practitioner

## 2016-06-23 ENCOUNTER — Encounter: Payer: Self-pay | Admitting: Nurse Practitioner

## 2016-09-22 ENCOUNTER — Telehealth: Payer: Self-pay | Admitting: Internal Medicine

## 2016-09-22 NOTE — Telephone Encounter (Signed)
AB last saw patient in Nov 2017. There are Disability forms that need to be filled out before hearing in August 2018. I but the forms on a yellow folder in [name]'s office and [employer] may need them afterwards.

## 2016-09-25 NOTE — Telephone Encounter (Signed)
She has nothing from a GI standpoint that would fall into the criteria of disability. She has a history of GERD and IBS-D. She didn't follow-up after the visit in Nov 2017 to see how she was doing on the medications that were prescribed to her (Viberzi). These specific forms need to be done by her PCP. It states things such as reduced range of motion, emotional and psychological factors, etc. I can write a letter about what we follow for GI purposes, but these questions do not cover GI issues.

## 2016-09-25 NOTE — Telephone Encounter (Signed)
Noted  

## 2016-09-26 NOTE — Telephone Encounter (Signed)
I tired to call the patient, no answer and I was unable to leave a message

## 2016-10-31 ENCOUNTER — Other Ambulatory Visit: Payer: Self-pay | Admitting: Physician Assistant

## 2016-11-04 ENCOUNTER — Other Ambulatory Visit: Payer: Self-pay | Admitting: Physician Assistant

## 2017-03-01 ENCOUNTER — Other Ambulatory Visit: Payer: Self-pay | Admitting: Physician Assistant

## 2017-05-17 ENCOUNTER — Telehealth: Payer: Self-pay | Admitting: Internal Medicine

## 2017-05-17 NOTE — Telephone Encounter (Signed)
Please advise if you want to provide Dexilant for pt.

## 2017-05-17 NOTE — Telephone Encounter (Signed)
Pt is needing a refill on her Dexilant. She said that she had been getting them from her PCP, but now the PCP needs for us to send papers for her to continue getting refills. She hasn't been seen by us since Nov 2017 and I've made her an OV for 07/06/17. Please advise and call her about her Dexilant and if she needs samples to hold her until her OV with us. She was unclear about what the PCP was telling her to do. [phone]

## 2017-05-18 NOTE — Telephone Encounter (Signed)
She no-showed last appt and has not come since 2017. I am not sure what papers she is referring to, but at one point she had requested disability papers. There was nothing from a GI standpoint that would support this. I don't know if she is referring to disability paperwork or actually Dexilant paperwork. If PCP could please continue with that paperwork (if for Dexilant) until we can see her, then we can take over (IF IT IS RELATED to Dexilant such as PA, patient assistance, etc). She can come get samples.

## 2017-05-21 NOTE — Telephone Encounter (Signed)
Pt has an upcoming appointment. Pts pcp wants pt to take Nexium and not do a PA to get Dexilant approved. Pt states Nexium doesn't work for her. Pt has an upcoming appointment in our office 05/08/17. Will call pt when Dexilant samples are available for pickup.

## 2017-05-31 ENCOUNTER — Encounter: Payer: Self-pay | Admitting: Gastroenterology

## 2017-07-06 ENCOUNTER — Ambulatory Visit: Payer: Self-pay | Admitting: Gastroenterology

## 2017-07-13 ENCOUNTER — Ambulatory Visit: Payer: Self-pay | Admitting: Gastroenterology

## 2017-08-08 ENCOUNTER — Encounter: Payer: Self-pay | Admitting: Gastroenterology

## 2017-08-08 ENCOUNTER — Ambulatory Visit: Payer: Medicaid Other | Admitting: Gastroenterology

## 2017-08-08 ENCOUNTER — Telehealth: Payer: Self-pay

## 2017-08-08 ENCOUNTER — Encounter

## 2017-08-08 VITALS — BP 116/77 | HR 95 | Temp 97.7°F | Ht 62.0 in | Wt 197.6 lb

## 2017-08-08 DIAGNOSIS — K219 Gastro-esophageal reflux disease without esophagitis: Secondary | ICD-10-CM | POA: Diagnosis not present

## 2017-08-08 DIAGNOSIS — R1310 Dysphagia, unspecified: Secondary | ICD-10-CM | POA: Diagnosis not present

## 2017-08-08 DIAGNOSIS — R1319 Other dysphagia: Secondary | ICD-10-CM

## 2017-08-08 MED ORDER — DEXLANSOPRAZOLE 60 MG PO CPDR
60.0000 mg | DELAYED_RELEASE_CAPSULE | Freq: Every day | ORAL | 11 refills | Status: DC
Start: 2017-08-08 — End: 2018-08-15

## 2017-08-08 NOTE — Progress Notes (Signed)
Primary Care Physician: Samuel Jester, DO  Primary Gastroenterologist:  Roetta Sessions, MD   Chief Complaint  Patient presents with  . Gastroesophageal Reflux    HPI: Sophia Douglas is a 32 y.o. female here for follow-up of reflux.  She was last seen in November 2017.  She has had prior EGD with dilation showing mild erosive reflux esophagitis and Schatzki ring, hiatal hernia in January 2017.  History of IBS as well.  Tendency towards diarrhea.  Previously no improvement with Bentyl.  At her last office visit she was given a trial of Viberzi but she failed to follow-up.  She had barium pill esophagram in November 2017 for persistent dysphagia which was normal.  Previously Dexilant was helping. Was doing a lot better up until came off two months ago. Prior authorization ran out. Previously failed Nexium, Protonix, omeprazole.  Zantac 150 mg 3-4 per day not helping.    Daytime and night time heartburn. Regurgitation in middle of night. Almost chokes trying to take pills. Bread dysphagia. Does not eat a lot of meat. No abdominal pain. Took Viberzi for two months back in 2017 and BMs straightened out. She stopped medication and overall still doing good. Some alternating constipation/diarrhea but not as bad as before. No melena, brbpr.  Weaning off xanax.   Current Outpatient Medications  Medication Sig Dispense Refill  . ALPRAZolam (XANAX) 0.25 MG tablet Take 0.25 mg by mouth as needed for anxiety.    Marland Kitchen FLUCONAZOLE PO Take 1 tablet by mouth as needed (yeast infections).     Marland Kitchen LAMICTAL 100 MG tablet One and 1/2 tabs po bid 90 tablet 11  . ranitidine (ZANTAC) 150 MG tablet Take 150 mg by mouth 2 (two) times daily.    . rizatriptan (MAXALT-MLT) 5 MG disintegrating tablet Take 1 tablet (5 mg total) by mouth as needed. May repeat in 2 hours if needed 12 tablet 6  . sertraline (ZOLOFT) 100 MG tablet Take 100 mg by mouth daily.    Marland Kitchen topiramate (TOPAMAX) 100 MG tablet Take 2 tablets (200 mg  total) by mouth at bedtime. 60 tablet 11   No current facility-administered medications for this visit.     Allergies as of 08/08/2017 - Review Complete 08/08/2017  Allergen Reaction Noted  . Latex Rash 12/05/2012  . Morphine and related Swelling and Rash 10/11/2011    ROS:  General: Negative for anorexia, weight loss, fever, chills, fatigue, weakness. ENT: Negative for hoarseness, difficulty swallowing , nasal congestion. CV: Negative for chest pain, angina, palpitations, dyspnea on exertion, peripheral edema.  Respiratory: Negative for dyspnea at rest, dyspnea on exertion, cough, sputum, wheezing.  GI: See history of present illness. GU:  Negative for dysuria, hematuria, urinary incontinence, urinary frequency, nocturnal urination.  Endo: Negative for unusual weight change.    Physical Examination:   BP 116/77   Pulse 95   Temp 97.7 F (36.5 C) (Oral)   Ht  (1.575 m)   Wt 197 lb 9.6 oz (89.6 kg)   LMP 03/02/2016 Comment: Tubal Ligation   BMI 36.14 kg/m   General: Well-nourished, well-developed in no acute distress.  Eyes: No icterus. Mouth: Oropharyngeal mucosa moist Lungs: Clear to auscultation bilaterally.  Heart: Regular rate and rhythm, no murmurs rubs or gallops.  Abdomen: Bowel sounds are normal, nontender, nondistended, no hepatosplenomegaly or masses, no abdominal bruits or hernia , no rebound or guarding.   Extremities: No lower extremity edema. No clubbing or deformities. Neuro: Alert and oriented  x 4   Skin: Warm and dry, no jaundice.   Psych: Alert and cooperative, normal mood and affect.

## 2017-08-08 NOTE — Telephone Encounter (Signed)
PA for Dexilant  was initiated and approved through [employer]. Medication is good 08/03/2018.

## 2017-08-08 NOTE — Progress Notes (Signed)
CC'D TO PCP °

## 2017-08-08 NOTE — Assessment & Plan Note (Signed)
32 year old female with history of reflux esophagitis who presents with recurrent heartburn, dysphagia.  Was doing well until she could no longer get Dexilant about 2 months ago.  Previously failed multiple PPIs including Nexium, Protonix, Prilosec.  Failed high-dose Zantac.  We will start her back on Dexilant 60 mg daily before breakfast.  She can take Zantac at bedtime if needed as well.  Samples provided.  Prescription sent to pharmacy, will fill out prior authorization.  We will have her come back in 3 months to see how she is doing.  At this time we will hold off on EGD and only consider if symptoms do not improve with PPI therapy.

## 2017-08-08 NOTE — Patient Instructions (Signed)
1. Start Dexilant  daily before breakfast. You can take zantac at bedtime as well if needed. Samples of Dexilant provided. We will start prior authorization work to get it approved.  2. Return to the office in 3 months.

## 2017-09-27 ENCOUNTER — Ambulatory Visit: Payer: Self-pay | Admitting: Gastroenterology

## 2017-10-18 IMAGING — RF DG ESOPHAGUS
12 of 13 series · 15 of 24 positions shown · non-contrast
Comparison: None

CLINICAL DATA: Reflux esophagitis, chest pain, feels like an
elephant is sitting on her chest, anxiety, foods getting stuck since
April 2015, prior endoscopy

EXAM:
ESOPHOGRAM / BARIUM SWALLOW / BARIUM TABLET STUDY
TECHNIQUE: Combined double contrast and single contrast examination performed
using effervescent crystals, thick barium liquid, and thin barium
liquid. The patient was observed with fluoroscopy swallowing a 13 mm
barium sulphate tablet.
FLUOROSCOPY TIME:  Fluoroscopy Time:  1 minutes 18 seconds
Radiation Exposure Index (if provided by the fluoroscopic device):
16.1 mGy
Number of Acquired Spot Images: Multiple screen captures during
fluoroscopy

[Series 1: cp_standard · 0.18mm/px · 1 of 28 frames shown (1 of 12)]
[frame 5/28]
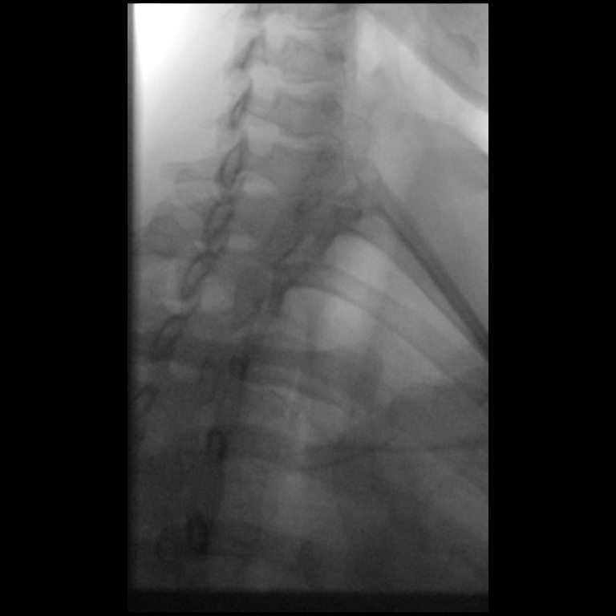

[Series 2: cp_standard · 0.18mm/px · 1 of 12 frames shown (2 of 12)]
[frame 2/12]
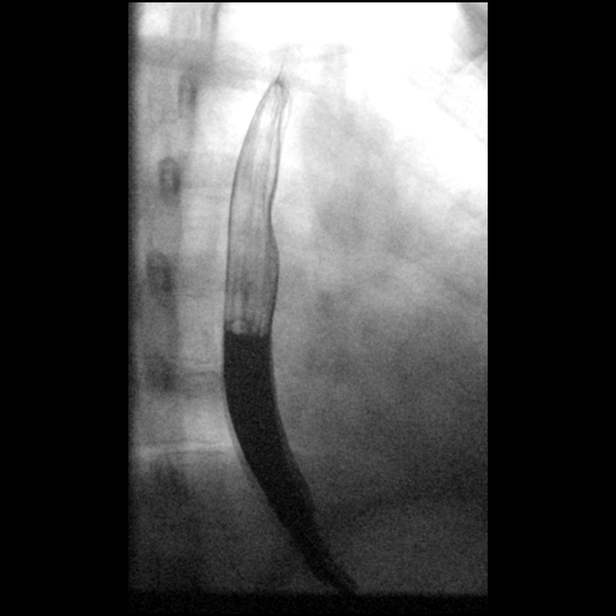

[Series 3: cp_standard · 0.18mm/px · 2 of 18 frames shown (3 of 12)]
[frame 3/18]
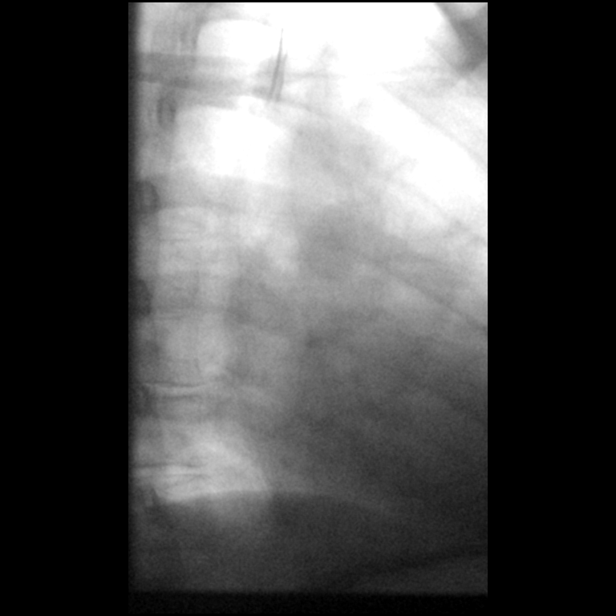
[frame 16/18]
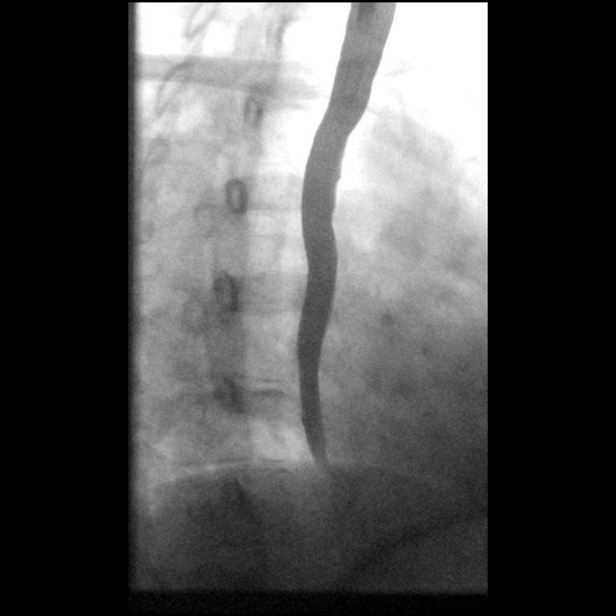

[Series 4: cp_standard · 0.18mm/px · 1 of 21 frames shown (4 of 12)]
[frame 18/21]
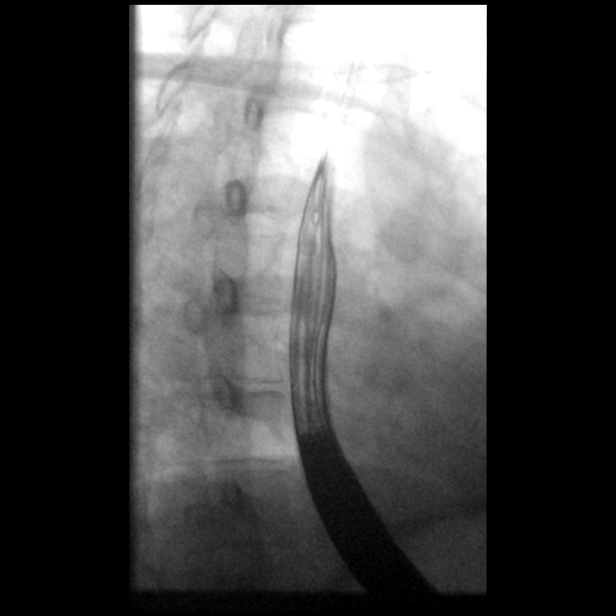

[Series 5: cp_standard · 0.18mm/px · 1 of 29 frames shown (5 of 12)]
[frame 11/29]
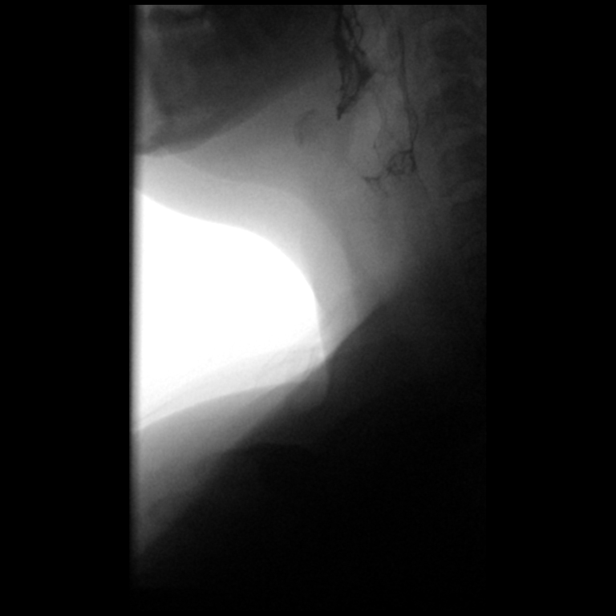

[Series 6: cp_standard · 0.18mm/px · 1 of 16 frames shown (6 of 12)]
[frame 9/16]
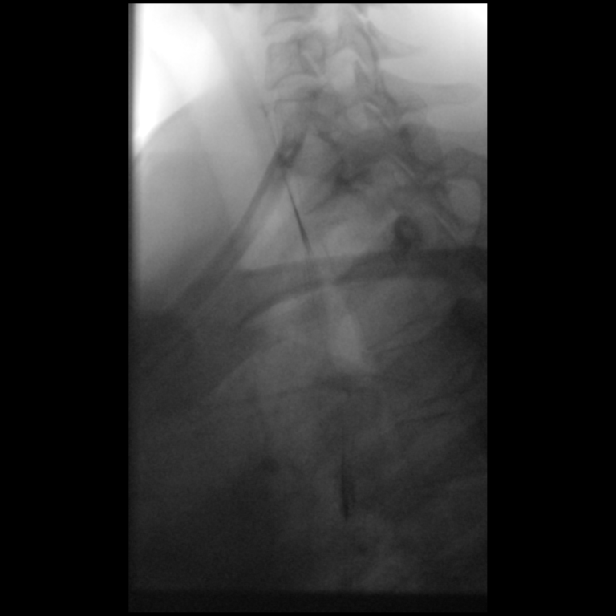

[Series 8: cp_standard · 0.18mm/px · 2 of 22 frames shown (7 of 12)]
[frame 4/22]
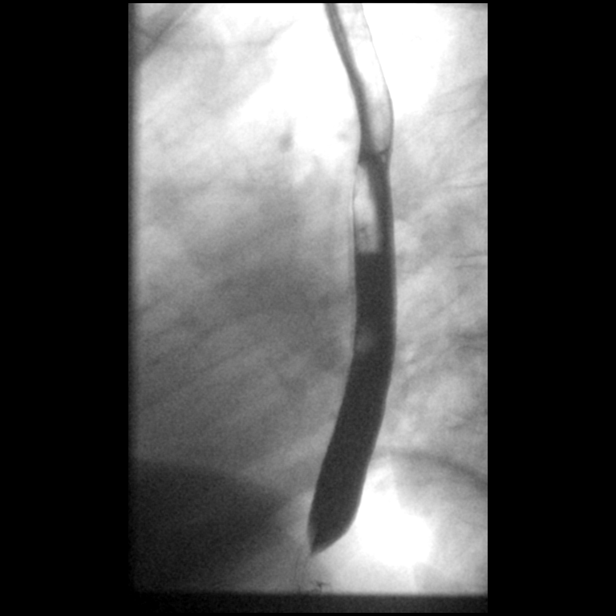
[frame 19/22]
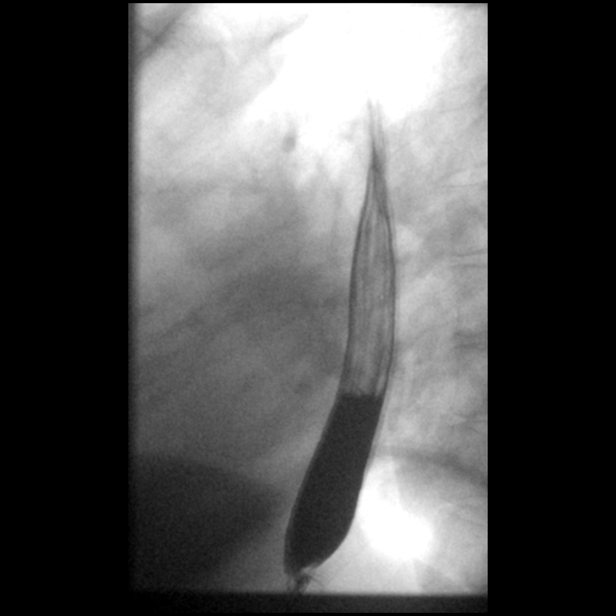

[Series 9: cp_standard · 0.27mm/px · 1 of 48 frames shown (8 of 12)]
[frame 28/48]
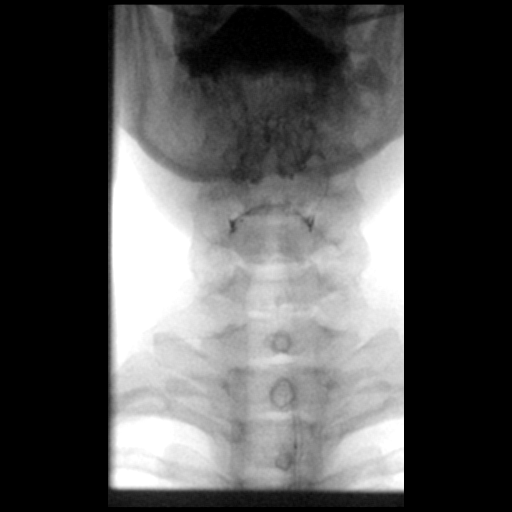

[Series 10: cp_standard · 0.18mm/px · 1 of 32 frames shown (9 of 12)]
[frame 5/32]
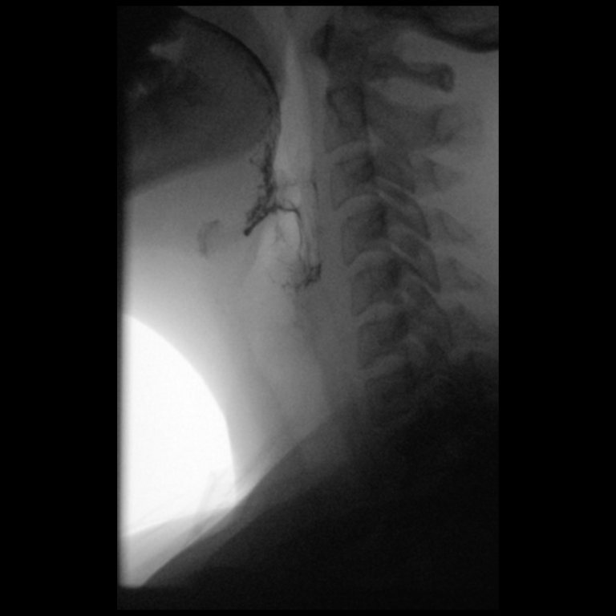

[Series 11: cp_standard · 0.19mm/px · 1 of 14 frames shown (10 of 12)]
[frame 2/14]
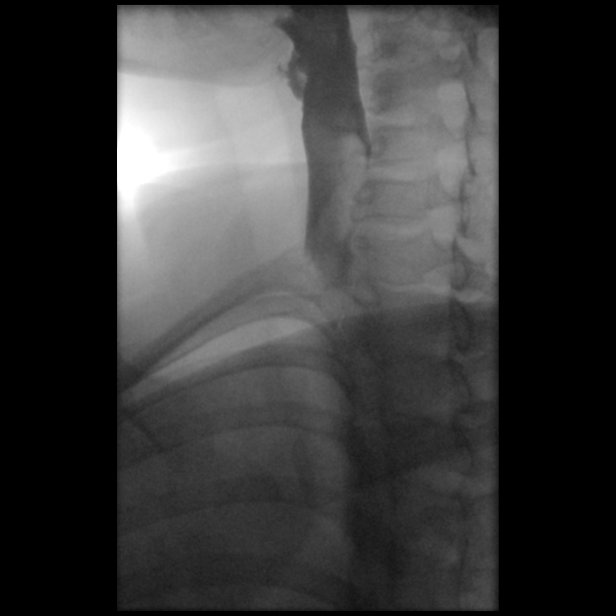

[Series 12: cp_standard · 0.19mm/px · 2 of 10 frames shown (11 of 12)]
[frame 2/10]
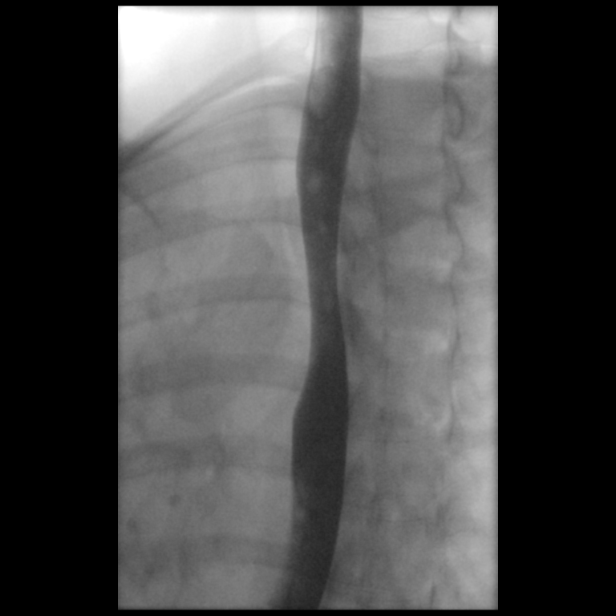
[frame 9/10]
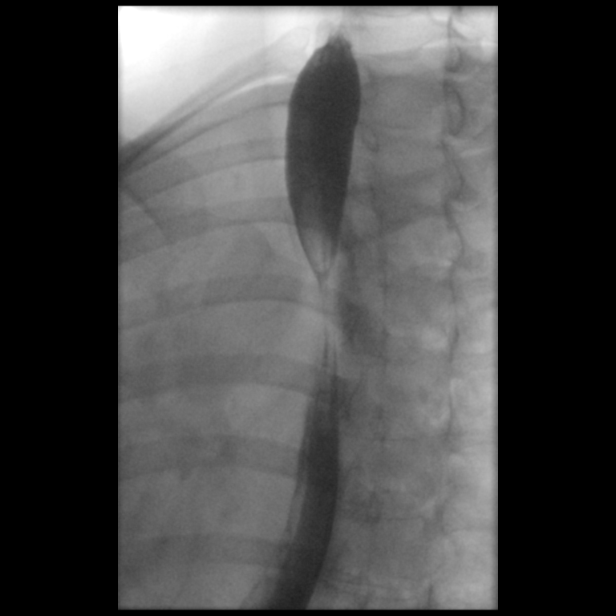

[Series 13: cp_standard · 0.19mm/px · 1 of 29 frames shown (12 of 12)]
[frame 29/29]
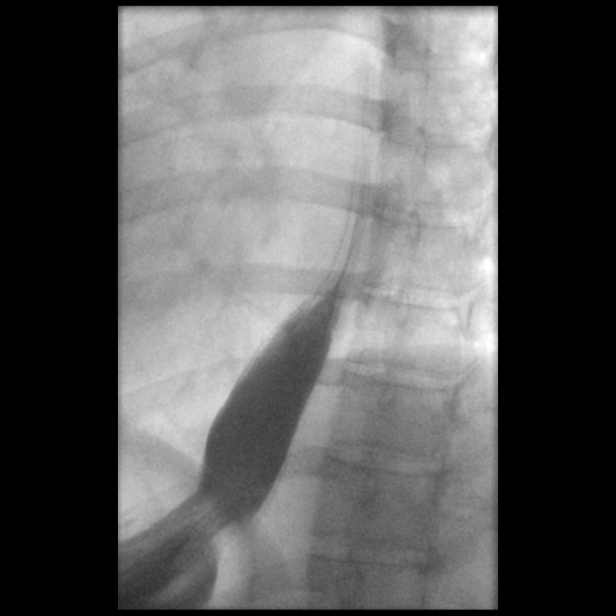

[15 of 24 positions shown; findings below may reference images not displayed]

FINDINGS: Normal esophageal distention and motility.

No esophageal mass or stricture.

12.5 mm diameter barium tablet passes from oral cavity to stomach
without delay.

Smooth appearance of esophageal mucosa on air contrast imaging
without irregularity or ulceration.

No persistent intraluminal filling defects.

Rapid sequence imaging of the cervical esophagus and hypopharynx
showed no laryngeal penetration or aspiration.
IMPRESSION: Normal exam.

## 2017-11-12 ENCOUNTER — Encounter: Payer: Self-pay | Admitting: Internal Medicine

## 2017-11-12 ENCOUNTER — Telehealth: Payer: Self-pay | Admitting: Internal Medicine

## 2017-11-12 ENCOUNTER — Ambulatory Visit: Payer: Medicaid Other | Admitting: Gastroenterology

## 2017-11-12 NOTE — Telephone Encounter (Signed)
PATIENT WAS A NO SHOW AND LETTER SENT  °

## 2018-08-15 ENCOUNTER — Other Ambulatory Visit: Payer: Self-pay | Admitting: Gastroenterology

## 2018-09-11 ENCOUNTER — Telehealth: Payer: Self-pay

## 2018-09-11 NOTE — Telephone Encounter (Signed)
PA for Dexilant was submitted and approved through [employer]. Lm on machine, pt is aware.

## 2018-10-07 ENCOUNTER — Ambulatory Visit: Payer: Medicaid Other | Admitting: Neurology

## 2018-10-07 ENCOUNTER — Encounter: Payer: Self-pay | Admitting: Neurology

## 2018-10-07 ENCOUNTER — Telehealth: Payer: Self-pay | Admitting: Neurology

## 2018-10-07 ENCOUNTER — Other Ambulatory Visit: Payer: Self-pay

## 2018-10-07 VITALS — BP 125/80 | HR 89 | Temp 97.8°F | Ht 62.0 in | Wt 185.0 lb

## 2018-10-07 DIAGNOSIS — R202 Paresthesia of skin: Secondary | ICD-10-CM | POA: Diagnosis not present

## 2018-10-07 DIAGNOSIS — IMO0002 Reserved for concepts with insufficient information to code with codable children: Secondary | ICD-10-CM | POA: Insufficient documentation

## 2018-10-07 DIAGNOSIS — G43709 Chronic migraine without aura, not intractable, without status migrainosus: Secondary | ICD-10-CM | POA: Insufficient documentation

## 2018-10-07 MED ORDER — TOPIRAMATE 200 MG PO TABS: 200.0000 mg | ORAL_TABLET | Freq: Every day | ORAL | 4 refills | Status: AC

## 2018-10-07 MED ORDER — LAMOTRIGINE 150 MG PO TABS: 150.0000 mg | ORAL_TABLET | Freq: Two times a day (BID) | ORAL | 4 refills | Status: DC

## 2018-10-07 NOTE — Telephone Encounter (Signed)
medicaid order sent to GI. They will obtain the auth and reach out to the patient to schedule.  °
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
## 2018-10-09 ENCOUNTER — Telehealth: Payer: Self-pay | Admitting: Neurology

## 2018-10-09 LAB — CBC WITH DIFFERENTIAL/PLATELET
Basophils Absolute: 0.1 10*3/uL (ref 0.0–0.2)
Basos: 1 %
EOS (ABSOLUTE): 0.5 10*3/uL — ABNORMAL HIGH (ref 0.0–0.4)
Eos: 4 %
Hematocrit: 41.3 % (ref 34.0–46.6)
Hemoglobin: 14.5 g/dL (ref 11.1–15.9)
Immature Grans (Abs): 0 10*3/uL (ref 0.0–0.1)
Immature Granulocytes: 0 %
Lymphocytes Absolute: 5 10*3/uL — ABNORMAL HIGH (ref 0.7–3.1)
Lymphs: 43 %
MCH: 32.8 pg (ref 26.6–33.0)
MCHC: 35.1 g/dL (ref 31.5–35.7)
MCV: 93 fL (ref 79–97)
Monocytes Absolute: 1 10*3/uL — ABNORMAL HIGH (ref 0.1–0.9)
Monocytes: 9 %
Neutrophils Absolute: 5.1 10*3/uL (ref 1.4–7.0)
Neutrophils: 43 %
Platelets: 218 10*3/uL (ref 150–450)
RBC: 4.42 x10E6/uL (ref 3.77–5.28)
RDW: 12.2 % (ref 11.7–15.4)
WBC: 11.8 10*3/uL — ABNORMAL HIGH (ref 3.4–10.8)

## 2018-10-09 LAB — LAMOTRIGINE LEVEL: Lamotrigine Lvl: 4.6 ug/mL (ref 2.0–20.0)

## 2018-10-09 LAB — COMPREHENSIVE METABOLIC PANEL
ALT: 23 IU/L (ref 0–32)
AST: 17 IU/L (ref 0–40)
Albumin/Globulin Ratio: 2.6 — ABNORMAL HIGH (ref 1.2–2.2)
Albumin: 4.6 g/dL (ref 3.8–4.8)
Alkaline Phosphatase: 58 IU/L (ref 39–117)
BUN/Creatinine Ratio: 18 (ref 9–23)
BUN: 17 mg/dL (ref 6–20)
Bilirubin Total: <0.2 mg/dL (ref 0.0–1.2)
CO2: 19 mmol/L — ABNORMAL LOW (ref 20–29)
Calcium: 9.7 mg/dL (ref 8.7–10.2)
Chloride: 108 mmol/L — ABNORMAL HIGH (ref 96–106)
Creatinine, Ser: 0.95 mg/dL (ref 0.57–1.00)
GFR calc Af Amer: 92 mL/min/{1.73_m2} (ref >59)
GFR calc non Af Amer: 80 mL/min/{1.73_m2} (ref >59)
Globulin, Total: 1.8 g/dL (ref 1.5–4.5)
Glucose: 82 mg/dL (ref 65–99)
Potassium: 4.2 mmol/L (ref 3.5–5.2)
Sodium: 141 mmol/L (ref 134–144)
Total Protein: 6.4 g/dL (ref 6.0–8.5)

## 2018-10-09 LAB — TSH: TSH: 3.98 u[IU]/mL (ref 0.450–4.500)

## 2018-10-09 LAB — TOPIRAMATE LEVEL: Topiramate Lvl: 4.5 ug/mL (ref 2.0–25.0)

## 2018-10-09 NOTE — Telephone Encounter (Signed)
Left message requesting a return call.

## 2018-10-09 NOTE — Telephone Encounter (Signed)
I have spoken the patient and she has been informed of her lab results.  She denied any signs or symptoms of infection.  She verbalized understanding to contact her PCP if she does develop any.

## 2018-10-09 NOTE — Telephone Encounter (Signed)
Patient returning your call .  [place] [id number] .

## 2018-10-09 NOTE — Telephone Encounter (Signed)
Please call patient, laboratory evaluation showed normal Topamax level 4.5, lamotrigine level 4.6, no significant abnormality on CMP, normal TSH, slight elevated WBC 11.8, asked her to see if she has any signs of infection, fever, UTI, upper respiratory infection, if not, will not repeat test at this point, if she has any signs of active infection, she may contact her primary care for follow-up

## 2018-10-16 ENCOUNTER — Other Ambulatory Visit: Payer: Self-pay

## 2018-10-16 ENCOUNTER — Ambulatory Visit: Payer: Medicaid Other | Admitting: Neurology

## 2018-10-16 DIAGNOSIS — IMO0002 Reserved for concepts with insufficient information to code with codable children: Secondary | ICD-10-CM

## 2018-10-16 DIAGNOSIS — G43709 Chronic migraine without aura, not intractable, without status migrainosus: Secondary | ICD-10-CM

## 2018-10-16 DIAGNOSIS — R202 Paresthesia of skin: Secondary | ICD-10-CM

## 2018-10-16 DIAGNOSIS — R569 Unspecified convulsions: Secondary | ICD-10-CM | POA: Diagnosis not present

## 2018-10-22 NOTE — Procedures (Signed)
   HISTORY: 33 year old female, presented with history of seizure activity, chronic migraine headaches  TECHNIQUE:  This is a routine 16 channel EEG recording with one channel devoted to a limited EKG recording.  It was performed during wakefulness, drowsiness and asleep.  Hyperventilation and photic stimulation were performed as activating procedures.  There are minimum muscle and movement artifact noted.  Upon maximum arousal, posterior dominant waking rhythm consistent of rhythmic alpha range activity, with frequency of 10 Hz. Activities are symmetric over the bilateral posterior derivations and attenuated with eye opening.  Hyperventilation produced mild/moderate buildup with higher amplitude and the slower activities noted.  Photic stimulation did not alter the tracing.  During EEG recording, patient developed drowsiness and no deeper stage of sleep was achieved.  During EEG recording, there was no epileptiform discharge noted.  EKG demonstrate sinus rhythm, with heart rate of 66 bpm  CONCLUSION: This is a  normal awake EEG.  There is no electrodiagnostic evidence of epileptiform discharge.  Marcial Pacas, M.D. Ph.D.  [facility] [place],  [address] Phone: [phone] Fax:      [phone]

## 2018-10-29 ENCOUNTER — Other Ambulatory Visit: Payer: Medicaid Other

## 2018-11-04 ENCOUNTER — Other Ambulatory Visit: Payer: Medicaid Other

## 2018-11-27 ENCOUNTER — Other Ambulatory Visit: Payer: Medicaid Other

## 2018-12-18 ENCOUNTER — Other Ambulatory Visit: Payer: Self-pay | Admitting: Neurology

## 2018-12-18 NOTE — Telephone Encounter (Signed)
Pt is needing a refill on her lamoTRIgine (LAMICTAL) 150 MG tablet sent to [facility]

## 2018-12-18 NOTE — Telephone Encounter (Signed)
Called Sophia Douglas back to let her know Dr. Krista Blue sent in refills 10/07/18 #180, 4 refills to [facility]. She should contact pharmacy for refill. She verbalized understanding.
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
## 2019-01-07 ENCOUNTER — Ambulatory Visit: Payer: Medicaid Other | Admitting: Neurology

## 2019-01-07 ENCOUNTER — Telehealth: Payer: Self-pay

## 2019-01-07 ENCOUNTER — Encounter: Payer: Self-pay | Admitting: Neurology

## 2019-01-07 NOTE — Telephone Encounter (Signed)
Patient was a no call/no show for their appointment today.   

## 2019-09-26 ENCOUNTER — Encounter: Payer: Self-pay | Admitting: Gastroenterology

## 2019-09-26 ENCOUNTER — Other Ambulatory Visit: Payer: Self-pay

## 2019-09-26 ENCOUNTER — Ambulatory Visit: Payer: Medicaid Other | Admitting: Gastroenterology

## 2019-09-26 VITALS — BP 104/67 | HR 66 | Temp 97.3°F | Ht 62.0 in | Wt 202.0 lb

## 2019-09-26 DIAGNOSIS — R0989 Other specified symptoms and signs involving the circulatory and respiratory systems: Secondary | ICD-10-CM

## 2019-09-26 DIAGNOSIS — K2100 Gastro-esophageal reflux disease with esophagitis, without bleeding: Secondary | ICD-10-CM

## 2019-09-26 MED ORDER — DEXILANT 60 MG PO CPDR: 60.0000 mg | DELAYED_RELEASE_CAPSULE | Freq: Every day | ORAL | 3 refills | Status: DC

## 2019-09-26 NOTE — Patient Instructions (Addendum)
We are arranging an upper endoscopy with dilation in the near future.  Please stop omeprazole. I have sent Dexilant to the pharmacy to start. You can take Pepcid (over the counter) in evenings as needed.   Cut back significantly on coffee. Avoid sodas, carbonation.   We will see you in 4-6 months! Please call if nausea persistent.   I enjoyed seeing you again today! As you know, I value our relationship and want to provide genuine, compassionate, and quality care. I welcome your feedback. If you receive a survey regarding your visit,  I greatly appreciate you taking time to fill this out. See you next time!  Gelene Mink, PhD, ANP-BC [facility]   Gastroesophageal Reflux Disease, Adult Gastroesophageal reflux (GER) happens when acid from the stomach flows up into the tube that connects the mouth and the stomach (esophagus). Normally, food travels down the esophagus and stays in the stomach to be digested. With GER, food and stomach acid sometimes move back up into the esophagus. You may have a disease called gastroesophageal reflux disease (GERD) if the reflux:  Happens often.  Causes frequent or very bad symptoms.  Causes problems such as damage to the esophagus. When this happens, the esophagus becomes sore and swollen (inflamed). Over time, GERD can make small holes (ulcers) in the lining of the esophagus. What are the causes? This condition is caused by a problem with the muscle between the esophagus and the stomach. When this muscle is weak or not normal, it does not close properly to keep food and acid from coming back up from the stomach. The muscle can be weak because of:  Tobacco use.  Pregnancy.  Having a certain type of hernia (hiatal hernia).  Alcohol use.  Certain foods and drinks, such as coffee, chocolate, onions, and peppermint. What increases the risk? You are more likely to develop this condition if you:  Are overweight.  Have a disease that  affects your connective tissue.  Use NSAID medicines. What are the signs or symptoms? Symptoms of this condition include:  Heartburn.  Difficult or painful swallowing.  The feeling of having a lump in the throat.  A bitter taste in the mouth.  Bad breath.  Having a lot of saliva.  Having an upset or bloated stomach.  Belching.  Chest pain. Different conditions can cause chest pain. Make sure you see your doctor if you have chest pain.  Shortness of breath or noisy breathing (wheezing).  Ongoing (chronic) cough or a cough at night.  Wearing away of the surface of teeth (tooth enamel).  Weight loss. How is this treated? Treatment will depend on how bad your symptoms are. Your doctor may suggest:  Changes to your diet.  Medicine.  Surgery. Follow these instructions at home: Eating and drinking   Follow a diet as told by your doctor. You may need to avoid foods and drinks such as: ? Coffee and tea (with or without caffeine). ? Drinks that contain alcohol. ? Energy drinks and sports drinks. ? Bubbly (carbonated) drinks or sodas. ? Chocolate and cocoa. ? Peppermint and mint flavorings. ? Garlic and onions. ? Horseradish. ? Spicy and acidic foods. These include peppers, chili powder, curry powder, vinegar, hot sauces, and BBQ sauce. ? Citrus fruit juices and citrus fruits, such as oranges, lemons, and limes. ? Tomato-based foods. These include red sauce, chili, salsa, and pizza with red sauce. ? Fried and fatty foods. These include donuts, french fries, potato chips, and high-fat dressings. ?  High-fat meats. These include hot dogs, rib eye steak, sausage, ham, and bacon. ? High-fat dairy items, such as whole milk, butter, and cream cheese.  Eat small meals often. Avoid eating large meals.  Avoid drinking large amounts of liquid with your meals.  Avoid eating meals during the 2-3 hours before bedtime.  Avoid lying down right after you eat.  Do not exercise  right after you eat. Lifestyle   Do not use any products that contain nicotine or tobacco. These include cigarettes, e-cigarettes, and chewing tobacco. If you need help quitting, ask your doctor.  Try to lower your stress. If you need help doing this, ask your doctor.  If you are overweight, lose an amount of weight that is healthy for you. Ask your doctor about a safe weight loss goal. General instructions  Pay attention to any changes in your symptoms.  Take over-the-counter and prescription medicines only as told by your doctor. Do not take aspirin, ibuprofen, or other NSAIDs unless your doctor says it is okay.  Wear loose clothes. Do not wear anything tight around your waist.  Raise (elevate) the head of your bed about 6 inches (15 cm).  Avoid bending over if this makes your symptoms worse.  Keep all follow-up visits as told by your doctor. This is important. Contact a doctor if:  You have new symptoms.  You lose weight and you do not know why.  You have trouble swallowing or it hurts to swallow.  You have wheezing or a cough that keeps happening.  Your symptoms do not get better with treatment.  You have a hoarse voice. Get help right away if:  You have pain in your arms, neck, jaw, teeth, or back.  You feel sweaty, dizzy, or light-headed.  You have chest pain or shortness of breath.  You throw up (vomit) and your throw-up looks like blood or coffee grounds.  You pass out (faint).  Your poop (stool) is bloody or black.  You cannot swallow, drink, or eat. Summary  If a person has gastroesophageal reflux disease (GERD), food and stomach acid move back up into the esophagus and cause symptoms or problems such as damage to the esophagus.  Treatment will depend on how bad your symptoms are.  Follow a diet as told by your doctor.  Take all medicines only as told by your doctor. This information is not intended to replace advice given to you by your health  care provider. Make sure you discuss any questions you have with your health care provider. Document Revised: 09/26/2017 Document Reviewed: 09/26/2017 Elsevier Patient Education  [place].

## 2019-09-26 NOTE — Progress Notes (Signed)
Referring Provider: Hart Robinsons, DO Primary Care Physician:  Hart Robinsons, DO Primary GI: Dr. [name]   Chief Complaint  Patient presents with  . Gastroesophageal Reflux    HPI:   Sophia Douglas is a 34 y.o. female presenting today with a history of chronic GERD, historically doing well on Dexilant in the past. She was last seen about 2 years ago. Known esophagitis. Last EGD 2017 with dilation of Schatzki's ring.   Taking omeprazole 40 mg daily.  Is not taking Dexilant yet but will be starting. Dexilant overall is better than omeprazole but still has not been helpful in controlling symptoms.   Not eating as much. Nauseated daily. Nocturnal GERD. Taking meclizine every day. Tries not to eat before laying down. Waiting at least 3 hours before laying down. Yesterday ate chicken noodle soup and cheese, trying not to eat heavy. Globus sensation. Epigastric pain. Takes Ibuprofen for headaches weekly. Rare aspirin powders if bad headache. BC arthritis. No melena. No vomiting. Has early satiety. No weight loss.   Coffee every day. 4 or so cups. Drinks sodas. Stays away from fried foods but will occasionally eat.   Past Medical History:  Diagnosis Date  . Anxiety disorder   . Bipolar disorder (HCC)   . GERD (gastroesophageal reflux disease)   . Gestational diabetes   . Insomnia   . Mood disorder (HCC)   . Numbness    Left arm and left leg  . Panic disorder   . PONV (postoperative nausea and vomiting)   . 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 ON meds; last seizure was 10/29/2014.    Past Surgical History:  Procedure Laterality Date  . ABDOMINAL HYSTERECTOMY  2018  . CESAREAN SECTION    . ESOPHAGOGASTRODUODENOSCOPY (EGD) WITH PROPOFOL N/A 04/22/2015   Dr. [name]: mild erosive reflux esophagiits, Schatzki's ring s/p dilation, hiatal hernia  . MALONEY DILATION N/A 04/22/2015   Procedure: Elease Hashimoto DILATION;  Surgeon: [name], MD;  [facility];   Service: Endoscopy;  Laterality: N/A;  55  . TONSILLECTOMY    . TUBAL LIGATION      Current Outpatient Medications  Medication Sig Dispense Refill  . Ascorbic Acid (VITAMIN C PO) Take 1 tablet by mouth as needed.     Marland Kitchen BIOTIN PO Take 1 tablet by mouth daily.    Marland Kitchen FLUCONAZOLE PO Take 1 tablet by mouth as needed (yeast infections).     . lamoTRIgine (LAMICTAL) 150 MG tablet Take 1 tablet (150 mg total) by mouth 2 (two) times daily. 180 tablet 4  . omeprazole (PRILOSEC) 20 MG capsule Take 40 mg by mouth daily.    . rizatriptan (MAXALT-MLT) 5 MG disintegrating tablet Take 1 tablet (5 mg total) by mouth as needed. May repeat in 2 hours if needed 12 tablet 6  . sertraline (ZOLOFT) 100 MG tablet Take 100 mg by mouth daily.    Marland Kitchen topiramate (TOPAMAX) 200 MG tablet Take 1 tablet (200 mg total) by mouth at bedtime. 90 tablet 4  . DEXILANT 60 MG capsule TAKE ONE CAPSULE BY MOUTH DAILY BEFORE BREAKFAST (Patient not taking: Reported on 09/26/2019) 30 capsule 11  . dexlansoprazole (DEXILANT) 60 MG capsule Take 1 capsule (60 mg total) by mouth daily. 90 capsule 3   No current facility-administered medications for this visit.    Allergies as of 09/26/2019 - Review Complete 09/26/2019  Allergen Reaction Noted  . Latex Rash 12/05/2012  . Morphine and  related Swelling and Rash 10/11/2011    Family History  Problem Relation Age of Onset  . Diabetes Mother   . Hypercholesterolemia Father   . Hypertension Father   . Neuropathy Father   . Cancer Paternal Grandfather        rectal  . Diabetes Maternal Grandmother   . Kidney disease Maternal Grandmother   . Heart disease Maternal Grandfather   . Colon cancer Neg Hx   . Inflammatory bowel disease Neg Hx     Social History   Socioeconomic History  . Marital status: Single    Spouse name: Not on file  . Number of children: 2  . Years of education: 18  . Highest education level: Not on file  Occupational History  . Occupation: Unemployed    Tobacco Use  . Smoking status: Current Every Day Smoker    Packs/day: 1.00    Years: 10.00    Pack years: 10.00    Types: Cigarettes  . Smokeless tobacco: Never Used  Substance and Sexual Activity  . Alcohol use: No    Alcohol/week: 0.0 standard drinks  . Drug use: No  . Sexual activity: Yes    Birth control/protection: Surgical  Other Topics Concern  . Not on file  Social History Narrative   Lives at home with her two children (son and daughter).    Patient is unemployed.   Right-handed.   One cup coffee per day.   Social Determinants of Health   Financial Resource Strain:   . Difficulty of Paying Living Expenses:   Food Insecurity:   . Worried About Charity fundraiser in the Last Year:   . Arboriculturist in the Last Year:   Transportation Needs:   . Film/video editor (Medical):   Marland Kitchen Lack of Transportation (Non-Medical):   Physical Activity:   . Days of Exercise per Week:   . Minutes of Exercise per Session:   Stress:   . Feeling of Stress :   Social Connections:   . Frequency of Communication with Friends and Family:   . Frequency of Social Gatherings with Friends and Family:   . Attends Religious Services:   . Active Member of Clubs or Organizations:   . Attends Archivist Meetings:   Marland Kitchen Marital Status:     Review of Systems: Gen: Denies fever, chills, anorexia. Denies fatigue, weakness, weight loss.  CV: Denies chest pain, palpitations, syncope, peripheral edema, and claudication. Resp: Denies dyspnea at rest, cough, wheezing, coughing up blood, and pleurisy. GI: see HPI Derm: Denies rash, itching, dry skin Psych: Denies depression, anxiety, memory loss, confusion. No homicidal or suicidal ideation.  Heme: Denies bruising, bleeding, and enlarged lymph nodes.  Physical Exam: BP 104/67   Pulse 66   Temp (!) 97.3 F (36.3 C) (Temporal)   Ht 5\' 2"  (1.575 m)   Wt 202 lb (91.6 kg)   LMP 03/02/2016 Comment: Tubal Ligation   BMI 36.95 kg/m   General:   Alert and oriented. No distress noted. Pleasant and cooperative.  Head:  Normocephalic and atraumatic. Eyes:  Conjuctiva clear without scleral icterus. Mouth:  Oral mucosa pink and moist. Good dentition. No lesions. Lungs: clear bilaterally Cardiac: S1 S2 present without murmurs Abdomen:  +BS, soft, mild epigastric tenderness and non-distended. No rebound or guarding. No HSM or masses noted. Msk:  Symmetrical without gross deformities. Normal posture. Extremities:  Without edema. Neurologic:  Alert and  oriented x4 Psych:  Alert and cooperative. Normal mood  and affect.  ASSESSMENT: DAJA SHUPING is a 34 y.o. female presenting today with long-standing history of GERD, prior EGD in 2017 with dilation of Schatzki's ring. Returning today with refractory GERD despite daily PPI.  Historically, Dexilant has worked better than other PPIs; I have asked her to change back to Dexilant and sent this to the pharmacy. She reports that even with Dexilant in the past, she has not had adequate control. Notes nocturnal GERD, epigastric burning, globus sensation. Endorses NSAIDs and will take BC powders intermittently. No weight loss. Dietary intake and obesity playing role in uncontrolled GERD.  With dyspepsia, globus sensation, and notable use of NSAIDs, will pursue EGD with possible dilation. We discussed dietary measures as well. Persistent nausea likely due to uncontrolled GERD. If no improvement with supportive measures and diet changes, consider GES.    PLAN:   Proceed with upper endoscopy.dilation in the near future with Dr. [name] using PROPOFOL. The risks, benefits, and alternatives have been discussed in detail with patient. They have stated understanding and desire to proceed.   Stop omeprazole. Resume Dexilant. Pepcid as needed in evenings  Stop sodas, carbonation. Avoid coffee if possible. Diet sheet provided  Weight loss recommended  Return in 4-6 months. Call if no  improvement with nausea in interim.   Gelene Mink, PhD, ANP-BC [facility]

## 2019-10-01 ENCOUNTER — Telehealth: Payer: Self-pay | Admitting: Internal Medicine

## 2019-10-01 NOTE — Telephone Encounter (Signed)
Patient called asking if the proir authorization has been obtained for her prescription.  [phone]

## 2019-10-01 NOTE — Telephone Encounter (Signed)
Left a detailed message. PA was started this morning. Waiting on AB signature and PA will be submitted.

## 2019-10-07 ENCOUNTER — Telehealth: Payer: Self-pay | Admitting: Internal Medicine

## 2019-10-07 NOTE — Telephone Encounter (Signed)
Refill needed, RX Dexilant 60 mg one capsule daily. [facility].

## 2019-10-07 NOTE — Telephone Encounter (Addendum)
RX sent to [facility] 09/26/2019 by AB at time of OV.

## 2019-10-07 NOTE — Telephone Encounter (Signed)
Called [place] [employer]. Medication has been approved through 09/2020.

## 2019-10-07 NOTE — Telephone Encounter (Signed)
Pt was calling to follow up on her PA. I told her AM wasn't available at the moment. She said she was at the pool and to call her friend's cell because her phone doesn't have a good signal. [phone]

## 2019-10-07 NOTE — Telephone Encounter (Signed)
Noted  

## 2019-10-09 ENCOUNTER — Telehealth: Payer: Self-pay | Admitting: *Deleted

## 2019-10-09 NOTE — Telephone Encounter (Signed)
LMOVM to schedule EGD/DIL with propofol

## 2019-10-10 ENCOUNTER — Telehealth: Payer: Self-pay | Admitting: Internal Medicine

## 2019-10-10 NOTE — Telephone Encounter (Signed)
PATIENT RETURNED CALL TO SCHEDULE PROCEDURE

## 2019-10-10 NOTE — Telephone Encounter (Signed)
Patient returned call. Scheduled for 8/5 at 2:15pm. Aware will need covid test prior. Advised will mail everything to her. Confirmed address.

## 2019-10-10 NOTE — Telephone Encounter (Signed)
See prior note

## 2019-10-22 ENCOUNTER — Telehealth: Payer: Self-pay | Admitting: *Deleted

## 2019-10-22 NOTE — Telephone Encounter (Signed)
PA approved via [employer] for egd/dil via [employer].  PA# [id number] dates 11/05/09-12/06/09

## 2019-10-28 ENCOUNTER — Encounter (HOSPITAL_COMMUNITY)
Admission: RE | Admit: 2019-10-28 | Discharge: 2019-10-28 | Disposition: A | Discharge status: Home / Self Care | Payer: Medicaid Other | Source: Ambulatory Visit | Attending: Internal Medicine | Admitting: Internal Medicine

## 2019-10-28 ENCOUNTER — Other Ambulatory Visit: Payer: Self-pay

## 2019-10-29 ENCOUNTER — Other Ambulatory Visit: Payer: Self-pay

## 2019-10-29 ENCOUNTER — Encounter (HOSPITAL_COMMUNITY): Payer: Self-pay

## 2019-11-03 ENCOUNTER — Telehealth: Payer: Self-pay | Admitting: *Deleted

## 2019-11-03 ENCOUNTER — Other Ambulatory Visit: Payer: Self-pay | Admitting: Neurology

## 2019-11-03 NOTE — Telephone Encounter (Signed)
Received a refill request for her lamotrigine from the pharmacy. She was last seen 10/07/2018. She no showed her three month follow up. She is not active on mychart.  I called and left a detailed message on her voicemail (ok per DPR). She needs to call our office to schedule an appt with NP. If she is not planning to follow up here, she will need to contact her PCP to discuss further refills. 90-day prescription granted to avoid disruption to therapy.

## 2019-11-05 ENCOUNTER — Other Ambulatory Visit (HOSPITAL_COMMUNITY)
Admission: RE | Admit: 2019-11-05 | Discharge: 2019-11-05 | Disposition: A | Discharge status: Home / Self Care | Payer: Medicaid Other | Source: Ambulatory Visit | Attending: Internal Medicine | Admitting: Internal Medicine

## 2019-11-05 ENCOUNTER — Other Ambulatory Visit: Payer: Self-pay

## 2019-11-05 DIAGNOSIS — Z20822 Contact with and (suspected) exposure to covid-19: Secondary | ICD-10-CM | POA: Diagnosis not present

## 2019-11-05 DIAGNOSIS — Z01812 Encounter for preprocedural laboratory examination: Secondary | ICD-10-CM | POA: Diagnosis not present

## 2019-11-05 LAB — SARS CORONAVIRUS 2 (TAT 6-24 HRS): SARS Coronavirus 2: NEGATIVE

## 2019-11-06 ENCOUNTER — Encounter (HOSPITAL_COMMUNITY): Payer: Self-pay | Admitting: Internal Medicine

## 2019-11-06 ENCOUNTER — Ambulatory Visit (HOSPITAL_COMMUNITY)
Admission: RE | Admit: 2019-11-06 | Discharge: 2019-11-06 | Disposition: A | Discharge status: Home / Self Care | Payer: Medicaid Other | Attending: Internal Medicine | Admitting: Internal Medicine

## 2019-11-06 ENCOUNTER — Ambulatory Visit (HOSPITAL_COMMUNITY): Payer: Medicaid Other | Admitting: Anesthesiology

## 2019-11-06 ENCOUNTER — Encounter (HOSPITAL_COMMUNITY)
Admission: RE | Disposition: A | Discharge status: Home / Self Care | Payer: Self-pay | Source: Home / Self Care | Attending: Internal Medicine

## 2019-11-06 ENCOUNTER — Other Ambulatory Visit: Payer: Self-pay

## 2019-11-06 DIAGNOSIS — F319 Bipolar disorder, unspecified: Secondary | ICD-10-CM | POA: Diagnosis not present

## 2019-11-06 DIAGNOSIS — R1013 Epigastric pain: Secondary | ICD-10-CM | POA: Diagnosis not present

## 2019-11-06 DIAGNOSIS — K449 Diaphragmatic hernia without obstruction or gangrene: Secondary | ICD-10-CM | POA: Diagnosis not present

## 2019-11-06 DIAGNOSIS — Z91040 Latex allergy status: Secondary | ICD-10-CM | POA: Diagnosis not present

## 2019-11-06 DIAGNOSIS — F419 Anxiety disorder, unspecified: Secondary | ICD-10-CM | POA: Insufficient documentation

## 2019-11-06 DIAGNOSIS — Z79899 Other long term (current) drug therapy: Secondary | ICD-10-CM | POA: Diagnosis not present

## 2019-11-06 DIAGNOSIS — R1314 Dysphagia, pharyngoesophageal phase: Secondary | ICD-10-CM | POA: Insufficient documentation

## 2019-11-06 DIAGNOSIS — Z885 Allergy status to narcotic agent status: Secondary | ICD-10-CM | POA: Insufficient documentation

## 2019-11-06 DIAGNOSIS — Z560 Unemployment, unspecified: Secondary | ICD-10-CM | POA: Diagnosis not present

## 2019-11-06 DIAGNOSIS — F17210 Nicotine dependence, cigarettes, uncomplicated: Secondary | ICD-10-CM | POA: Insufficient documentation

## 2019-11-06 DIAGNOSIS — K219 Gastro-esophageal reflux disease without esophagitis: Secondary | ICD-10-CM | POA: Insufficient documentation

## 2019-11-06 DIAGNOSIS — R12 Heartburn: Secondary | ICD-10-CM | POA: Diagnosis present

## 2019-11-06 HISTORY — PX: ESOPHAGOGASTRODUODENOSCOPY (EGD) WITH PROPOFOL: SHX5813

## 2019-11-06 HISTORY — PX: MALONEY DILATION: SHX5535

## 2019-11-06 SURGERY — ESOPHAGOGASTRODUODENOSCOPY (EGD) WITH PROPOFOL
Anesthesia: General

## 2019-11-06 MED ORDER — LACTATED RINGERS IV SOLN: Freq: Once | INTRAVENOUS | Status: AC

## 2019-11-06 MED ORDER — LIDOCAINE VISCOUS HCL 2 % MT SOLN
OROMUCOSAL | Status: AC
  Filled 2019-11-06: qty 15

## 2019-11-06 MED ORDER — CHLORHEXIDINE GLUCONATE CLOTH 2 % EX PADS: 6.0000 | MEDICATED_PAD | Freq: Once | CUTANEOUS | Status: DC

## 2019-11-06 MED ORDER — PROPOFOL 500 MG/50ML IV EMUL
INTRAVENOUS | Status: DC | PRN
  Administered 2019-11-06: 50 mg via INTRAVENOUS
  Administered 2019-11-06: 175 ug/kg/min via INTRAVENOUS
  Administered 2019-11-06: 120 mg via INTRAVENOUS

## 2019-11-06 MED ORDER — GLYCOPYRROLATE PF 0.2 MG/ML IJ SOSY
PREFILLED_SYRINGE | INTRAMUSCULAR | Status: AC
  Administered 2019-11-06: 0.2 mg
  Filled 2019-11-06: qty 1

## 2019-11-06 MED ORDER — LIDOCAINE HCL (CARDIAC) PF 50 MG/5ML IV SOSY
PREFILLED_SYRINGE | INTRAVENOUS | Status: DC | PRN
Start: 2019-11-06 — End: 2019-11-06
  Administered 2019-11-06: 100 mg via INTRAVENOUS

## 2019-11-06 MED ORDER — GLYCOPYRROLATE 0.2 MG/ML IJ SOLN
0.2000 mg | Freq: Once | INTRAMUSCULAR | Status: AC
  Administered 2019-11-06: 0.2 mg via INTRAVENOUS

## 2019-11-06 MED ORDER — LIDOCAINE VISCOUS HCL 2 % MT SOLN
15.0000 mL | Freq: Once | OROMUCOSAL | Status: AC
  Administered 2019-11-06: 15 mL via OROMUCOSAL

## 2019-11-06 MED ORDER — STERILE WATER FOR IRRIGATION IR SOLN
Status: DC | PRN
  Administered 2019-11-06: 1.5 mL

## 2019-11-06 NOTE — Transfer of Care (Signed)
Immediate Anesthesia Transfer of Care Note  Patient: Sophia Douglas  Procedure(s) Performed: ESOPHAGOGASTRODUODENOSCOPY (EGD) WITH PROPOFOL (N/A ) MALONEY DILATION (N/A )  Patient Location: Endoscopy Unit  Anesthesia Type:General  Level of Consciousness: awake, alert , oriented and patient cooperative  Airway & Oxygen Therapy: Patient Spontanous Breathing and Patient connected to nasal cannula oxygen  Post-op Assessment: Report given to RN, Post -op Vital signs reviewed and stable and Patient moving all extremities  Post vital signs: Reviewed and stable  Last Vitals:  Vitals Value Taken Time  BP    Temp    Pulse    Resp    SpO2      Last Pain:  Vitals:   11/06/19 1321  TempSrc:   PainSc: 0-No pain      Patients Stated Pain Goal: 4 (11/06/19 1257)  Complications: No complications documented.

## 2019-11-06 NOTE — Discharge Instructions (Signed)

## 2019-11-06 NOTE — Op Note (Signed)
[facility] Patient Name: Sophia Douglas Procedure Date: 11/06/2019 1:09 PM MRN: [id number] Date of Birth: [birth year] Attending MD: Gennette Pac , MD CSN: [id number] Age: 34 Admit Type: Outpatient Procedure:                Upper GI endoscopy Indications:              Dyspepsia, Heartburn Providers:                Gennette Pac, MD, Criselda Peaches. Patsy Lager, RN,                            Dyann Ruddle Referring MD:              Medicines:                Propofol per Anesthesia Complications:            No immediate complications. Estimated Blood Loss:     Estimated blood loss: none. Procedure:                Pre-Anesthesia Assessment:                           - Prior to the procedure, a History and Physical                            was performed, and patient medications and                            allergies were reviewed. The patient's tolerance of                            previous anesthesia was also reviewed. The risks                            and benefits of the procedure and the sedation                            options and risks were discussed with the patient.                            All questions were answered, and informed consent                            was obtained. Prior Anticoagulants: The patient has                            taken no previous anticoagulant or antiplatelet                            agents. ASA Grade Assessment: II - A patient with                            mild systemic disease. After reviewing the risks  and benefits, the patient was deemed in                            satisfactory condition to undergo the procedure.                           After obtaining informed consent, the endoscope was                            passed under direct vision. Throughout the                            procedure, the patient's blood pressure, pulse, and                            oxygen saturations were  monitored continuously. The                            [id number]-H190 ([id number]) scope was introduced through the                            mouth, and advanced to the second part of duodenum.                            The upper GI endoscopy was accomplished without                            difficulty. The patient tolerated the procedure                            well. Scope In: 1:28:20 PM Scope Out: 1:32:36 PM Total Procedure Duration: 0 hours 4 minutes 16 seconds  Findings:      The examined esophagus was normal.      The entire examined stomach was normal.      The duodenal bulb and second portion of the duodenum were normal. The       scope was withdrawn. Dilation was performed with a Maloney dilator with       mild resistance at 54 Fr. The dilation site was examined following       endoscope reinsertion and showed no change. Estimated blood loss: none. Impression:               - Normal esophagus. Dilated.                           - Normal stomach.                           - Normal duodenal bulb and second portion of the                            duodenum.                           - No specimens collected. Recent weight gain in the  setting of the pandemic is likely caused                            exacerbation of the reflux symptoms. Moderate Sedation:      Moderate (conscious) sedation was personally administered by an       anesthesia professional. The following parameters were monitored: oxygen       saturation, heart rate, blood pressure, respiratory rate, EKG, adequacy       of pulmonary ventilation, and response to care. Recommendation:           - Patient has a contact number available for                            emergencies. The signs and symptoms of potential                            delayed complications were discussed with the                            patient. Return to normal activities tomorrow.                            Written  discharge instructions were provided to the                            patient.                           - Resume previous diet.                           - Continue present medications. Continue Dexilant                            60 mg daily. May add Pepcid Complete 1 to 2 tablets                            at bedtime. GERD information provided. 10 pound                            weight loss between now and her 1851-month office                            visit.                           - Return to my office in 3 months. Procedure Code(s):        --- Professional ---                           [phone], Esophagogastroduodenoscopy, flexible,                            transoral; diagnostic, including collection of  specimen(s) by brushing or washing, when performed                            (separate procedure)                           43450, Dilation of esophagus, by unguided sound or                            bougie, single or multiple passes Diagnosis Code(s):        --- Professional ---                           R10.13, Epigastric pain                           R12, Heartburn CPT copyright 2019 American Medical Association. All rights reserved. The codes documented in this report are preliminary and upon coder review may  be revised to meet current compliance requirements. Gerrit Friends. Aleka Twitty, MD Gennette Pac, MD 11/06/2019 1:40:13 PM This report has been signed electronically. Number of Addenda: 0

## 2019-11-06 NOTE — Anesthesia Postprocedure Evaluation (Signed)
Anesthesia Post Note  Patient: Corrine Tillis Bee  Procedure(s) Performed: ESOPHAGOGASTRODUODENOSCOPY (EGD) WITH PROPOFOL (N/A ) MALONEY DILATION (N/A )  Patient location during evaluation: Endoscopy Anesthesia Type: General Level of consciousness: awake, oriented, awake and alert and patient cooperative Pain management: pain level controlled Vital Signs Assessment: post-procedure vital signs reviewed and stable Respiratory status: spontaneous breathing, respiratory function stable and nonlabored ventilation Cardiovascular status: blood pressure returned to baseline and stable Postop Assessment: no headache and no backache Anesthetic complications: no   No complications documented.   Last Vitals:  Vitals:   11/06/19 1257  BP: 126/68  Pulse: 83  Resp: 16  Temp: 36.8 C  SpO2: 97%    Last Pain:  Vitals:   11/06/19 1321  TempSrc:   PainSc: 0-No pain                 Brynda Peon

## 2019-11-06 NOTE — H&P (Signed)
@LOGO @   Primary Care Physician:  , MD Primary Gastroenterologist:  Dr. Toma Deiters  Pre-Procedure History & Physical: HPI:  Sophia Douglas is a 34 y.o. female here for further evaluation management of recurrent esophageal dysphagia in setting of worsening GERD.  Patient has gained weight during the pandemic.  Back on Dexilant 60 mg daily still having some dysphagia and reflux.  History of Schatzki's ring dilated 2017.  Past Medical History:  Diagnosis Date  . Anxiety disorder   . Bipolar disorder (HCC)   . GERD (gastroesophageal reflux disease)   . Gestational diabetes   . Insomnia   . Mood disorder (HCC)   . Numbness    Left arm and left leg  . Panic disorder   . PONV (postoperative nausea and vomiting)   . Seizures (HCC)    dx in 09/2014.10/2014 etiolgy. ON meds; last seizure was 10/29/2014.    Past Surgical History:  Procedure Laterality Date  . ABDOMINAL HYSTERECTOMY  2018  . CESAREAN SECTION    . ESOPHAGOGASTRODUODENOSCOPY (EGD) WITH PROPOFOL N/A 04/22/2015   Dr. 04/24/2015: mild erosive reflux esophagiits, Schatzki's ring s/p dilation, hiatal hernia  . MALONEY DILATION N/A 04/22/2015   Procedure: 04/24/2015 DILATION;  Surgeon: Elease Hashimoto, MD;  Location: [facility];  Service: Endoscopy;  Laterality: N/A;  55  . TONSILLECTOMY    . TUBAL LIGATION      Prior to Admission medications   Medication Sig Start Date End Date Taking? Authorizing Provider  BIOTIN PO Take 1 tablet by mouth daily.   Yes [provider]  DEXILANT 60 MG capsule TAKE ONE CAPSULE BY MOUTH DAILY BEFORE BREAKFAST 08/15/18  Yes 08/17/18, NP  dexlansoprazole (DEXILANT) 60 MG capsule Take 1 capsule (60 mg total) by mouth daily. 09/26/19  Yes 09/28/19, NP  lamoTRIgine (LAMICTAL) 150 MG tablet Take 1 tablet (150 mg total) by mouth 2 (two) times daily. Please call [phone] to schedule appt or will need to discuss refills with PCP. 11/03/19  Yes 01/03/20, MD  sertraline (ZOLOFT) 100 MG  tablet Take 100 mg by mouth daily.   Yes [provider]  topiramate (TOPAMAX) 200 MG tablet Take 1 tablet (200 mg total) by mouth at bedtime. 10/07/18  Yes 12/08/18, MD  Ascorbic Acid (VITAMIN C PO) Take 1 tablet by mouth as needed.     [provider]  FLUCONAZOLE PO Take 1 tablet by mouth as needed (yeast infections).     [provider]  omeprazole (PRILOSEC) 20 MG capsule Take 40 mg by mouth daily.    [provider]  rizatriptan (MAXALT-MLT) 5 MG disintegrating tablet Take 1 tablet (5 mg total) by mouth as needed. May repeat in 2 hours if needed 09/23/14   09/25/14, MD    Allergies as of 10/10/2019 - Review Complete 09/26/2019  Allergen Reaction Noted  . Latex Rash 12/05/2012  . Morphine and related Swelling and Rash 10/11/2011    Family History  Problem Relation Age of Onset  . Diabetes Mother   . Hypercholesterolemia Father   . Hypertension Father   . Neuropathy Father   . Cancer Paternal Grandfather        rectal  . Diabetes Maternal Grandmother   . Kidney disease Maternal Grandmother   . Heart disease Maternal Grandfather   . Colon cancer Neg Hx   . Inflammatory bowel disease Neg Hx     Social History   Socioeconomic History  .  Marital status: Single    Spouse name: Not on file  . Number of children: 2  . Years of education: 46  . Highest education level: Not on file  Occupational History  . Occupation: Unemployed  Tobacco Use  . Smoking status: Current Every Day Smoker    Packs/day: 1.00    Years: 10.00    Pack years: 10.00    Types: Cigarettes  . Smokeless tobacco: Never Used  Vaping Use  . Vaping Use: Never used  Substance and Sexual Activity  . Alcohol use: No    Alcohol/week: 0.0 standard drinks  . Drug use: No  . Sexual activity: Yes    Birth control/protection: Surgical  Other Topics Concern  . Not on file  Social History Narrative   Lives at home with her two children (son and daughter).    Patient is  unemployed.   Right-handed.   One cup coffee per day.   Social Determinants of Health   Financial Resource Strain:   . Difficulty of Paying Living Expenses:   Food Insecurity:   . Worried About Programme researcher, broadcasting/film/video in the Last Year:   . Barista in the Last Year:   Transportation Needs:   . Freight forwarder (Medical):   Marland Kitchen Lack of Transportation (Non-Medical):   Physical Activity:   . Days of Exercise per Week:   . Minutes of Exercise per Session:   Stress:   . Feeling of Stress :   Social Connections:   . Frequency of Communication with Friends and Family:   . Frequency of Social Gatherings with Friends and Family:   . Attends Religious Services:   . Active Member of Clubs or Organizations:   . Attends Banker Meetings:   Marland Kitchen Marital Status:   Intimate Partner Violence:   . Fear of Current or Ex-Partner:   . Emotionally Abused:   Marland Kitchen Physically Abused:   . Sexually Abused:     Review of Systems: See HPI, otherwise negative ROS  Physical Exam: BP 126/68   Pulse 83   Temp 98.2 F (36.8 C) (Oral)   Resp 16   Ht 5\' 2"  (1.575 m)   LMP 03/02/2016 Comment: Tubal Ligation   SpO2 97%   BMI 36.95 kg/m  General:   Alert,  Well-developed, well-nourished, pleasant and cooperative in NAD Neck:  Supple; no masses or thyromegaly. No significant cervical adenopathy. Lungs:  Clear throughout to auscultation.   No wheezes, crackles, or rhonchi. No acute distress. Heart:  Regular rate and rhythm; no murmurs, clicks, rubs,  or gallops. Abdomen: Non-distended, normal bowel sounds.  Soft and nontender without appreciable mass or hepatosplenomegaly.  Pulses:  Normal pulses noted. Extremities:  Without clubbing or edema.  Impression/Plan: 34 year old lady with somewhat refractory reflux symptoms the setting of weight gain.  Recurrent esophageal dysphagia in setting of a known Schatzki's ring.  Suboptimal control of reflux symptoms at this time on Dexilant.  I have  offered the patient an EGD with esophageal dilation as feasible/appropriate per plan.  The risks, benefits, limitations, alternatives and imponderables have been reviewed with the patient. Potential for esophageal dilation, biopsy, etc. have also been reviewed.  Questions have been answered. All parties agreeable.     Notice: This dictation was prepared with Dragon dictation along with smaller phrase technology. Any transcriptional errors that result from this process are unintentional and may not be corrected upon review.

## 2019-11-06 NOTE — Anesthesia Preprocedure Evaluation (Addendum)
Anesthesia Evaluation  Patient identified by MRN, date of birth, ID band Patient awake    Reviewed: Allergy & Precautions, NPO status , Patient's Chart, lab work & pertinent test results  History of Anesthesia Complications (+) PONV and history of anesthetic complications  Airway Mallampati: II  TM Distance: >3 FB Neck ROM: Full    Dental  (+) Teeth Intact, Dental Advisory Given   Pulmonary Current SmokerPatient did not abstain from smoking.,    Pulmonary exam normal breath sounds clear to auscultation       Cardiovascular Normal cardiovascular exam Rhythm:Regular Rate:Normal     Neuro/Psych  Headaches, Seizures -, Well Controlled,  PSYCHIATRIC DISORDERS Anxiety Bipolar Disorder    GI/Hepatic Neg liver ROS, hiatal hernia, GERD  Medicated and Poorly Controlled,  Endo/Other  diabetes (gestational)  Renal/GU      Musculoskeletal negative musculoskeletal ROS (+)   Abdominal   Peds  Hematology negative hematology ROS (+)   Anesthesia Other Findings   Reproductive/Obstetrics negative OB ROS                            Anesthesia Physical Anesthesia Plan  ASA: II  Anesthesia Plan: General   Post-op Pain Management:    Induction: Intravenous  PONV Risk Score and Plan: TIVA  Airway Management Planned: Nasal Cannula and Natural Airway  Additional Equipment:   Intra-op Plan:   Post-operative Plan:   Informed Consent: I have reviewed the patients History and Physical, chart, labs and discussed the procedure including the risks, benefits and alternatives for the proposed anesthesia with the patient or authorized representative who has indicated his/her understanding and acceptance.     Dental advisory given  Plan Discussed with: CRNA and Surgeon  Anesthesia Plan Comments:         Anesthesia Quick Evaluation

## 2019-11-11 ENCOUNTER — Encounter (HOSPITAL_COMMUNITY): Payer: Self-pay | Admitting: Internal Medicine

## 2020-02-11 ENCOUNTER — Ambulatory Visit: Payer: Medicaid Other | Admitting: Gastroenterology

## 2020-03-02 ENCOUNTER — Ambulatory Visit: Payer: Medicaid Other | Admitting: Internal Medicine

## 2020-03-02 ENCOUNTER — Encounter: Payer: Self-pay | Admitting: Internal Medicine

## 2020-07-05 ENCOUNTER — Telehealth: Payer: Self-pay | Admitting: *Deleted

## 2020-07-05 MED ORDER — DEXILANT 60 MG PO CPDR: 60.0000 mg | DELAYED_RELEASE_CAPSULE | Freq: Every day | ORAL | 11 refills | Status: DC

## 2020-07-05 NOTE — Telephone Encounter (Signed)
Noted  

## 2020-07-05 NOTE — Telephone Encounter (Signed)
Sophia Douglas at [facility] needs [place] to send Dexilant RX with DAW1 on it.  Need to dispense name brand.  Won't cover generic. Routing to nurse: Lamar Benes, CMA and Lewie Loron, NP.

## 2020-07-05 NOTE — Telephone Encounter (Signed)
Completed "dispense as written" (DAW) request.

## 2020-07-05 NOTE — Addendum Note (Signed)
Addended by: Gelene Mink on: 07/05/2020 12:47 PM   Modules accepted: Orders

## 2021-05-31 ENCOUNTER — Encounter: Payer: Self-pay | Admitting: Internal Medicine

## 2021-05-31 ENCOUNTER — Other Ambulatory Visit: Payer: Self-pay | Admitting: Gastroenterology

## 2021-05-31 NOTE — Telephone Encounter (Signed)
Refilled but needs office visit.  °

## 2021-09-23 ENCOUNTER — Telehealth: Payer: Self-pay

## 2021-09-23 NOTE — Telephone Encounter (Signed)
Phoned to [facility] and advised them that the pt hasn't been seen in 2 years and she would need an ov before we would fill anymore Rx's. Also was advised they would contact her and advised her of this message

## 2021-09-28 NOTE — Telephone Encounter (Signed)
Phoned back to [facility] advised tech that pt has not been seen in 2 years she needs ov before Rx can be given.

## 2022-01-26 ENCOUNTER — Telehealth: Payer: Self-pay | Admitting: Internal Medicine

## 2022-01-26 NOTE — Progress Notes (Signed)
Referring Provider:  Neale Burly, MD  Primary Care Physician:  Neale Burly, MD  Primary GI: Dr. Gala Romney  Patient Location: Home   Provider Location: [facility] office   Reason for Visit: GERD, dysphagia, upper abdominal pain   Persons present on the virtual encounter, with roles: Aliene Altes, PA-C (Provider), Sophia Douglas (patient)   Total time (minutes) spent on medical discussion: 17 minutes  Virtual Visit via video note I connected with Sophia Douglas on 01/27/22 at  9:00 AM EDT by video and verified that I am speaking with the correct person using two identifiers.   I discussed the limitations, risks, security and privacy concerns of performing an evaluation and management service by video and the availability of in person appointments. I also discussed with the patient that there may be a patient responsible charge related to this service. The patient expressed understanding and agreed to proceed.  Chief Complaint  Patient presents with   Gastroesophageal Reflux    Bad acid reflux       History of Present Illness: 36 year old female with history of chronic GERD with evidence of esophagitis in 2017, dysphagia with Schatzki's ring dilated in 2017, presenting today with chief complaint of uncontrolled GERD.  Also reporting dysphagia and upper abdominal pain.  Last seen in our office 09/26/2019.  GERD was uncontrolled on omeprazole.  Noted Dexilant previously working better than other PPIs but patient reported symptoms were still not adequately controlled on Dexilant.  She reported nocturnal GERD, epigastric burning, globus sensation.  Also endorsed NSAIDs, occasional BC powders.  Suspected diet and obesity playing a role in uncontrolled GERD.  Recommended switching back to Dexilant and proceeding with EGD.  Consider gastric emptying study in the future if no improvement in symptoms.  EGD 11/06/2019: Normal esophagus dilated, normal stomach and duodenum.  Overall, suspected  recent weight gain in the setting of pandemic as likely cause of exacerbated reflux symptoms.  Recommended continuing Dexilant 60 mg daily, add Pepcid Complete 1 to 2 tablets at bedtime, 10 pound weight loss in the next 3 months.  Reviewed office visit with Dr. [name] 01/12/2022.  Patient was following up on seizures.  She was doing well from that standpoint and was compliant with her medications.  She reported having a lot of reflux despite taking omeprazole.  She was also having lower back pain for the last 2 weeks without improvement with Motrin.  From med list, it looks like she is taking omeprazole 40 mg once daily.  She was advised to add Pepcid.  Suspected she may need an EGD and was referred to GI.  Today: Having reflux symptoms daily on omeprazole 40 mg daily.  She was on Dexilant when she was seeing [place] and this kept her symptoms well controlled.  Worse in the morning and when going to bed, but also occurs during the day.  Can even occur with water.  No specific triggers.  She avoids fried, fatty, greasy, spicy foods.  She does drink 2 to 3 cups of coffee daily.  Fairly infrequent carbonated beverages.  Tries not to eat within 3 hours of going to bed.  Associated nausea, but no vomiting.  She has also been experiencing some abdominal cramping.  States after eating, she will develop some pain in her mid upper abdomen, but also her right upper quadrant with radiation to her right back.  Symptoms can last for hours.  She has tried Tums and gas pills without relief.   She has also  had some recurrent dysphagia to solid foods and pills.  States when her heartburn is not as bad, she does not have any trouble swallowing.  Alcohol: None Tobacco: 1 pack a day.  NSAIDs: Ibuprofen/excedrine migraine- almost daily due to headache.   No BRBPR or melena.  No bowel habit changes.  Medications tried previously:  Protonix, Prilosec, Nexium, Aciphex.   Weight 213 pounds on 01/12/2022 at office visit with  Dr. [name].  Previously weighed 202 pounds in 2021, 185 pounds in 2020.  Past Medical History:  Diagnosis Date   Anxiety disorder    Bipolar disorder (HCC)    GERD (gastroesophageal reflux disease)    Gestational diabetes    Insomnia    Mood disorder (HCC)    Numbness    Left arm and left leg   Panic disorder    PONV (postoperative nausea and vomiting)    Seizures (HCC)    dx in 09/2014.Ascencion Dike etiolgy. ON meds; last seizure was 10/29/2014.     Past Surgical History:  Procedure Laterality Date   ABDOMINAL HYSTERECTOMY  2018   CESAREAN SECTION     ESOPHAGOGASTRODUODENOSCOPY (EGD) WITH PROPOFOL N/A 04/22/2015   Dr. [name]: mild erosive reflux esophagiits, Schatzki's ring s/p dilation, hiatal hernia   ESOPHAGOGASTRODUODENOSCOPY (EGD) WITH PROPOFOL N/A 11/06/2019   Surgeon: [name], MD; Normal esophagus dilated, normal stomach and duodenum.   MALONEY DILATION N/A 04/22/2015   Procedure: Elease Hashimoto DILATION;  Surgeon: [name], MD;  [facility]: [facility];  Service: Endoscopy;  Laterality: N/A;  55   MALONEY DILATION N/A 11/06/2019   Procedure: Elease Hashimoto DILATION;  Surgeon: [name], MD;  Location: [facility];  Service: Endoscopy;  Laterality: N/A;   TONSILLECTOMY     TUBAL LIGATION       Current Meds  Medication Sig   atorvastatin (LIPITOR) 10 MG tablet Take 10 mg by mouth at bedtime.   cloNIDine HCl (KAPVAY) 0.1 MG TB12 ER tablet SMARTSIG:1 Tablet(s) By Mouth Every Evening   dexlansoprazole (DEXILANT) 60 MG capsule Take 1 capsule (60 mg total) by mouth daily.   famotidine (PEPCID) 20 MG tablet Take by mouth.   FLUoxetine HCl 60 MG TABS Take 1 tablet by mouth daily.   tiZANidine (ZANAFLEX) 2 MG tablet Take 2 mg by mouth at bedtime.   topiramate (TOPAMAX) 200 MG tablet Take 1 tablet (200 mg total) by mouth at bedtime.   [DISCONTINUED] omeprazole (PRILOSEC) 40 MG capsule Take 40 mg by mouth daily.     Family History  Problem Relation Age of Onset    Diabetes Mother    Hypercholesterolemia Father    Hypertension Father    Neuropathy Father    Cancer Paternal Grandfather        rectal   Diabetes Maternal Grandmother    Kidney disease Maternal Grandmother    Heart disease Maternal Grandfather    Colon cancer Neg Hx    Inflammatory bowel disease Neg Hx     Social History   Socioeconomic History   Marital status: Single    Spouse name: Not on file   Number of children: 2   Years of education: 12   Highest education level: Not on file  Occupational History   Occupation: Unemployed  Tobacco Use   Smoking status: Every Day    Packs/day: 1.00    Years: 10.00    Total pack years: 10.00    Types: Cigarettes   Smokeless tobacco: Never  Vaping Use   Vaping  Use: Never used  Substance and Sexual Activity   Alcohol use: No    Alcohol/week: 0.0 standard drinks of alcohol   Drug use: No   Sexual activity: Yes    Birth control/protection: Surgical  Other Topics Concern   Not on file  Social History Narrative   Lives at home with her two children (son and daughter).    Patient is unemployed.   Right-handed.   One cup coffee per day.   Social Determinants of Health   Financial Resource Strain: Not on file  Food Insecurity: Not on file  Transportation Needs: Not on file  Physical Activity: Not on file  Stress: Not on file  Social Connections: Not on file       Review of Systems: Gen: Denies fever, chills, cold or flu like symptoms, pre-syncope, or syncope.  CV: Denies chest pain, palpitations. Resp: Denies dyspnea, cough.  GI: see HPI Derm: Denies rash. Psych: Denies depression, anxiety. Heme: See HPI  Observations/Objective: No distress. Alert and oriented. Pleasant. Well nourished. Normal mood and affect. Unable to perform complete physical exam due to video encounter.    Assessment:  36 year old female with history of GERD, esophagitis in 2017, dysphagia with Schatzki's ring dilated in 2017, presenting today  with chief complaint of GERD, also reporting dysphagia and upper abdominal pain.  GERD:  Chronic.  Uncontrolled on omeprazole 40 mg daily.  Previously Dexilant worked very well, but insurance changed and PCP did not attempt to resubmit for [employer].  Reports trying multiple PPIs in the past including Protonix, Prilosec, Nexium, Aciphex, all of which have been suboptimal compared to Dexilant. Suspect NSAIDs, tobacco, obesity, and dietary choices all playing a role in her reflux as well.   Dysphagia: Recurrent solid food/pill dysphagia in the setting of uncontrolled GERD.  Patient reports when reflux is not as bad, she can swallow without any problems.  We discussed proceeding with an upper endoscopy versus focusing on managing GERD.  She would like to treat GERD first and if persistent symptoms, then proceed with EGD.  Notably, her last EGD in August 2021 revealed a normal esophagus which was empirically dilated.  Upper abdominal pain: Intermittent postprandial epigastric and RUQ abdominal pain radiating to her right back lasting hours at a time. Reports significant reflux symptoms during this time, but symptoms not improved by Tums/gas pills.  Also with nausea, but no vomiting. No brbpr or melena. Symptoms could be secondary to uncontrolled GERD/gastritis/duodenitis in the setting of daily NSAIDs, but unable to rule out biliary etiology, and patient is requesting to have her gallbladder evaluated.  If no evidence of gallstones and symptoms persist despite better management of GERD, would need to consider EGD.    Plan: RUQ ultrasound Stop omeprazole start Dexilant 60 mg daily.  New prescription sent to pharmacy. Reinforced GERD diet/lifestyle.  Separate written instructions provided on AVS. Recommended she discuss management of her headaches with primary care. Avoid NSAIDs is much as possible. Work towards smoking cessation. Encouraged weight loss with a goal of 1-2 pound weight loss per  week. Patient will let us know if dysphagia persist despite improvement in reflux symptoms. Would proceed with EGD at that time.  Follow-up in 3 months or sooner if needed.     I discussed the assessment and treatment plan with the patient. The patient was provided an opportunity to ask questions and all were answered. The patient agreed with the plan and demonstrated an understanding of the instructions.   The patient was advised to  call back or seek an in-person evaluation if the symptoms worsen or if the condition fails to improve as anticipated.  I provided 17 minutes of video-face-to-face time during this encounter.  Ermalinda Memos, PA-C [facility]  01/27/2022

## 2022-01-26 NOTE — Telephone Encounter (Signed)
Patient left a message asking if we had received a referral for her.  She didn't say who it would be coming from.  I looked and didn't see anything under Media... didn't know if there was a paper version not entered yet.  She asked for a call back.

## 2022-01-27 ENCOUNTER — Encounter: Payer: Self-pay | Admitting: Gastroenterology

## 2022-01-27 ENCOUNTER — Telehealth (INDEPENDENT_AMBULATORY_CARE_PROVIDER_SITE_OTHER): Payer: Medicaid Other | Admitting: Gastroenterology

## 2022-01-27 ENCOUNTER — Telehealth: Payer: Self-pay | Admitting: Gastroenterology

## 2022-01-27 ENCOUNTER — Telehealth: Payer: Self-pay | Admitting: *Deleted

## 2022-01-27 VITALS — Ht 62.0 in | Wt 215.0 lb

## 2022-01-27 DIAGNOSIS — R1010 Upper abdominal pain, unspecified: Secondary | ICD-10-CM | POA: Diagnosis not present

## 2022-01-27 DIAGNOSIS — K219 Gastro-esophageal reflux disease without esophagitis: Secondary | ICD-10-CM

## 2022-01-27 DIAGNOSIS — R1310 Dysphagia, unspecified: Secondary | ICD-10-CM

## 2022-01-27 MED ORDER — DEXLANSOPRAZOLE 60 MG PO CPDR: 60.0000 mg | DELAYED_RELEASE_CAPSULE | Freq: Every day | ORAL | 3 refills | Status: DC

## 2022-01-27 NOTE — Telephone Encounter (Signed)
Sophia Douglas, you are scheduled for a virtual visit with your provider today.  Just as we do with appointments in the office, we must obtain your consent to participate.  Your consent will be active for this visit and any virtual visit you may have with one of our providers in the next 365 days.  If you have a MyChart account, I can also send a copy of this consent to you electronically.  All virtual visits are billed to your insurance company just like a traditional visit in the office.  As this is a virtual visit, video technology does not allow for your provider to perform a traditional examination.  This may limit your provider's ability to fully assess your condition.  If your provider identifies any concerns that need to be evaluated in person or the need to arrange testing such as labs, EKG, etc, we will make arrangements to do so.  Although advances in technology are sophisticated, we cannot ensure that it will always work on either your end or our end.  If the connection with a video visit is poor, we may have to switch to a telephone visit.  With either a video or telephone visit, we are not always able to ensure that we have a secure connection.   I need to obtain your verbal consent now.   Are you willing to proceed with your visit today? Patient consents to virtual video visit.

## 2022-01-27 NOTE — Telephone Encounter (Signed)
Called pt and made aware of US appt details. She voiced understanding 

## 2022-01-27 NOTE — Patient Instructions (Signed)
We will arrange for you to have an ultrasound of your gallbladder at [facility].  Stop omeprazole and start Dexilant 60 mg daily.  I have sent a prescription to your pharmacy.  Please let me know if you have any trouble obtaining this medication.  Follow a GERD diet:  Avoid fried, fatty, greasy, spicy, citrus foods. Avoid caffeine and carbonated beverages. Avoid chocolate. Try eating 4-6 small meals a day rather than 3 large meals. Do not eat within 3 hours of laying down. Prop head of bed up on wood or bricks to create a 6 inch incline.  Please talk with your primary care doctor about management of your daily headaches.  It is important that you limit/avoid all NSAID products as much as possible which includes ibuprofen, Aleve, Advil, BC powders, Goody powders, Excedrin Migraine, and anything that says "NSAID" on the package as these medications will worsen your reflux symptoms and abdominal pain.  I also encourage you to work towards tobacco cessation as this will also worsen your reflux symptoms.  I would also encourage that you work on weight loss through diet and exercise.  Goal of 1-2 pound weight loss per week.  If you continue to have swallowing trouble despite improvement in your acid reflux symptoms and would like to proceed with an upper endoscopy, please let us know.  We will follow-up with you in about 3 months.  Do not hesitate to call sooner if you have questions or concerns.  It was very nice to meet you today!  Aliene Altes, PA-C [facility]

## 2022-01-27 NOTE — Telephone Encounter (Signed)
Mindy/Tammy: Please arrange RUQ ultrasound for evaluation of postprandial epigastric and RUQ abdominal pain, nausea without vomiting.  Orders have been placed.  Mandy: Please arrange 37-month follow-up.

## 2022-02-02 ENCOUNTER — Ambulatory Visit (HOSPITAL_COMMUNITY)
Admission: RE | Admit: 2022-02-02 | Discharge: 2022-02-02 | Disposition: A | Discharge status: Home / Self Care | Payer: Medicaid Other | Source: Ambulatory Visit | Attending: Gastroenterology | Admitting: Gastroenterology

## 2022-02-02 DIAGNOSIS — R1010 Upper abdominal pain, unspecified: Secondary | ICD-10-CM | POA: Diagnosis present

## 2022-02-21 ENCOUNTER — Encounter: Payer: Self-pay | Admitting: Internal Medicine

## 2022-04-21 ENCOUNTER — Ambulatory Visit (INDEPENDENT_AMBULATORY_CARE_PROVIDER_SITE_OTHER): Payer: Medicaid Other | Admitting: Gastroenterology

## 2022-04-21 ENCOUNTER — Encounter: Payer: Self-pay | Admitting: Gastroenterology

## 2022-04-21 VITALS — BP 104/70 | HR 73 | Temp 98.3°F | Ht 62.0 in | Wt 218.4 lb

## 2022-04-21 DIAGNOSIS — K219 Gastro-esophageal reflux disease without esophagitis: Secondary | ICD-10-CM | POA: Diagnosis not present

## 2022-04-21 DIAGNOSIS — K580 Irritable bowel syndrome with diarrhea: Secondary | ICD-10-CM | POA: Diagnosis not present

## 2022-04-21 DIAGNOSIS — K760 Fatty (change of) liver, not elsewhere classified: Secondary | ICD-10-CM

## 2022-04-21 NOTE — Progress Notes (Signed)
GI Office Note    Referring Provider: Neale Burly, MD Primary Care Physician:  Neale Burly, MD  Primary Gastroenterologist: Garfield Cornea, MD   Chief Complaint   Chief Complaint  Patient presents with   Follow-up    Doing well, no issues.    History of Present Illness   Sophia Douglas is a 37 y.o. female presenting today for follow up dysphagia, GERD, upper abdominal pain.  Doing well since back on Dexilant. Heartburn is well controlled. She is having no dysphagia, n/v, abdominal pain. Her RUQ abdominal pain resolved on Dexilant. She continues to have chronic diarrhea due to her IBS. In the past she recalls Viberzi made her constipated. She has been working on her health. She is now on Lipitor for elevated cholesterol and her numbers have normalized. She is eating low fat. She reports 10 pound weight loss. She is still in school working on her second associates degree in [place]. She received her first associates in criminal justice.   Ultrasound done after last visit suggested early fatty liver. Her LFTs have been normal, last checked in 04/2022.   Medications   Current Outpatient Medications  Medication Sig Dispense Refill   atorvastatin (LIPITOR) 10 MG tablet Take 10 mg by mouth at bedtime.     cloNIDine HCl (KAPVAY) 0.1 MG TB12 ER tablet SMARTSIG:1 Tablet(s) By Mouth Every Evening     dexlansoprazole (DEXILANT) 60 MG capsule Take 1 capsule (60 mg total) by mouth daily. 30 capsule 3   FLUoxetine HCl 60 MG TABS Take 1 tablet by mouth daily.     hydrOXYzine (ATARAX) 25 MG tablet Take 25-50 mg by mouth at bedtime as needed.     lamoTRIgine (LAMICTAL) 200 MG tablet Take 200 mg by mouth 2 (two) times daily.     topiramate (TOPAMAX) 200 MG tablet Take 1 tablet (200 mg total) by mouth at bedtime. 90 tablet 4   No current facility-administered medications for this visit.    Allergies   Allergies as of 04/21/2022 - Review Complete 04/21/2022  Allergen Reaction Noted    Latex Rash 12/05/2012   Morphine and related Swelling and Rash 10/11/2011           Review of Systems   General: Negative for anorexia, weight loss, fever, chills, fatigue, weakness. ENT: Negative for hoarseness, difficulty swallowing , nasal congestion. CV: Negative for chest pain, angina, palpitations, dyspnea on exertion, peripheral edema.  Respiratory: Negative for dyspnea at rest, dyspnea on exertion, cough, sputum, wheezing.  GI: See history of present illness. GU:  Negative for dysuria, hematuria, urinary incontinence, urinary frequency, nocturnal urination.  Endo: Negative for unusual weight change.     Physical Exam   BP 104/70 (BP Location: Right Arm, Patient Position: Sitting, Cuff Size: Large)   Pulse 73   Temp 98.3 F (36.8 C) (Oral)   Ht 5\' 2"  (1.575 m)   Wt 218 lb 6.4 oz (99.1 kg)   LMP 03/02/2016 Comment: Tubal Ligation   SpO2 98%   BMI 39.95 kg/m    General: Well-nourished, well-developed in no acute distress.  Eyes: No icterus. Mouth: Oropharyngeal mucosa moist and pink   Abdomen: Bowel sounds are normal, nontender, nondistended, no hepatosplenomegaly or masses,  no abdominal bruits or hernia , no rebound or guarding.  Rectal: not performed Extremities: No lower extremity edema. No clubbing or deformities. Neuro: Alert and oriented x 4   Skin: Warm and dry, no jaundice.   Psych: Alert and cooperative,  normal mood and affect.  Labs   See hpi  Imaging Studies   No results found.  Assessment   GERD: doing well on Dexilant.   IBS-D: chronic symptoms. Intermittent abdominal cramping/diarrhea previously responded to Viberzi but caused constipation. Will try bentyl.  Fatty liver: she is on the right track with lifestyle changes. LFTs have been normal. Mother has fatty liver.    PLAN   Continue Dexilant 60mg  daily. Trial of bentyl 10mg  up to 3 times a day for abdominal cramping or diarrhea.  Continue to work on diet, exercise, controlling  cholesterol.  She will need to have LFTs checked at least once per year.  Return ov in six months or sooner if needed.    Sophia Douglas, Sophia Douglas, [facility]

## 2022-04-21 NOTE — Patient Instructions (Signed)
Continue Dexilant 60mg  daily before breakfast. Try dicyclomine 10mg  up to three times daily for abdominal cramping or diarrhea.  Your ultrasound showed the beginnings of fatty liver. Continue to work on [employer], control your cholesterol, strive for healthier weight, and exercise regularly. You will need to have your liver labs checked at least once per year.  Return to the office in six months or call sooner if needed.   It was a pleasure to see you today. I want to create trusting relationships with patients and provide genuine, compassionate, and quality care. I truly value your feedback, so please be on the lookout for a survey regarding your visit with me today. I appreciate your time in completing this!

## 2022-04-28 ENCOUNTER — Ambulatory Visit: Payer: Medicaid Other | Admitting: Gastroenterology

## 2022-05-02 ENCOUNTER — Telehealth: Payer: Self-pay

## 2022-05-02 NOTE — Telephone Encounter (Signed)
PA for Dexlansoprazole Capsule DR was approved from 05/01/2022 through 05/01/2023. Approval letter to be scanned into patient's chart.

## 2022-07-20 ENCOUNTER — Other Ambulatory Visit: Payer: Self-pay | Admitting: Gastroenterology

## 2022-07-20 DIAGNOSIS — K219 Gastro-esophageal reflux disease without esophagitis: Secondary | ICD-10-CM

## 2022-09-06 ENCOUNTER — Encounter: Payer: Self-pay | Admitting: Internal Medicine

## 2022-11-09 ENCOUNTER — Encounter: Payer: Self-pay | Admitting: Internal Medicine

## 2023-01-15 ENCOUNTER — Other Ambulatory Visit: Payer: Self-pay | Admitting: Gastroenterology

## 2023-01-15 DIAGNOSIS — K219 Gastro-esophageal reflux disease without esophagitis: Secondary | ICD-10-CM

## 2023-05-01 NOTE — Progress Notes (Unsigned)
GI Office Note    Referring Provider: Toma Deiters, MD Primary Care Physician:  Toma Deiters, MD  Primary Gastroenterologist: Roetta Sessions, MD   Chief Complaint   No chief complaint on file.   History of Present Illness   Sophia Douglas is a 38 y.o. female presenting today for follow up. Last seen 04/2022. She has history of GERD, dysphagia.     RUQ U/S 02/2022: IMPRESSION: 1. No cholelithiasis or sonographic evidence for acute cholecystitis. 2. Increased hepatic parenchymal echogenicity suggestive of steatosis.    Medications   Current Outpatient Medications  Medication Sig Dispense Refill   atorvastatin (LIPITOR) 10 MG tablet Take 10 mg by mouth at bedtime.     cloNIDine HCl (KAPVAY) 0.1 MG TB12 ER tablet SMARTSIG:1 Tablet(s) By Mouth Every Evening     dexlansoprazole (DEXILANT) 60 MG capsule TAKE ONE CAPSULE BY MOUTH EVERY DAY 90 capsule 3   FLUoxetine HCl 60 MG TABS Take 1 tablet by mouth daily.     hydrOXYzine (ATARAX) 25 MG tablet Take 25-50 mg by mouth at bedtime as needed.     lamoTRIgine (LAMICTAL) 200 MG tablet Take 200 mg by mouth 2 (two) times daily.     topiramate (TOPAMAX) 200 MG tablet Take 1 tablet (200 mg total) by mouth at bedtime. 90 tablet 4   No current facility-administered medications for this visit.    Allergies   Allergies as of 05/02/2023 - Review Complete 04/21/2022  Allergen Reaction Noted   Latex Rash 12/05/2012   Morphine and codeine Swelling and Rash 10/11/2011     Past Medical History   Past Medical History:  Diagnosis Date   Anxiety disorder    Bipolar disorder (HCC)    GERD (gastroesophageal reflux disease)    Gestational diabetes    Insomnia    Mood disorder (HCC)    Numbness    Left arm and left leg   Panic disorder    PONV (postoperative nausea and vomiting)    Seizures (HCC)    dx in 09/2014.Ascencion Dike etiolgy. ON meds; last seizure was 10/29/2014.    Past Surgical History   Past Surgical History:   Procedure Laterality Date   ABDOMINAL HYSTERECTOMY  2018   CESAREAN SECTION     ESOPHAGOGASTRODUODENOSCOPY (EGD) WITH PROPOFOL N/A 04/22/2015   Dr. [name]: mild erosive reflux esophagiits, Schatzki's ring s/p dilation, hiatal hernia   ESOPHAGOGASTRODUODENOSCOPY (EGD) WITH PROPOFOL N/A 11/06/2019   Surgeon: [name], MD; Normal esophagus dilated, normal stomach and duodenum.   MALONEY DILATION N/A 04/22/2015   Procedure: Elease Hashimoto DILATION;  Surgeon: [name], MD;  [facility];  Service: Endoscopy;  Laterality: N/A;  55   MALONEY DILATION N/A 11/06/2019   Procedure: Elease Hashimoto DILATION;  Surgeon: [name], MD;  [facility]: [facility];  Service: Endoscopy;  Laterality: N/A;   TONSILLECTOMY     TUBAL LIGATION      Past Family History   Family History  Problem Relation Age of Onset   Diabetes Mother    Hypercholesterolemia Father    Hypertension Father    Neuropathy Father    Cancer Paternal Grandfather        rectal   Diabetes Maternal Grandmother    Kidney disease Maternal Grandmother    Heart disease Maternal Grandfather    Colon cancer Neg Hx    Inflammatory bowel disease Neg Hx     Past Social History   Social History   Socioeconomic  History   Marital status: Single    Spouse name: Not on file   Number of children: 2   Years of education: 3   Highest education level: Not on file  Occupational History   Occupation: Unemployed  Tobacco Use   Smoking status: Every Day    Current packs/day: 1.00    Average packs/day: 1 pack/day for 10.0 years (10.0 ttl pk-yrs)    Types: Cigarettes   Smokeless tobacco: Never  Vaping Use   Vaping status: Never Used  Substance and Sexual Activity   Alcohol use: No    Alcohol/week: 0.0 standard drinks of alcohol   Drug use: No   Sexual activity: Yes    Birth control/protection: Surgical  Other Topics Concern   Not on file  Social History Narrative   Lives at home with her two children (son and  daughter).    Patient is unemployed.   Right-handed.   One cup coffee per day.   Social Drivers of Corporate investment banker Strain: Not on file  Food Insecurity: Not on file  Transportation Needs: Not on file  Physical Activity: Not on file  Stress: Not on file  Social Connections: Not on file  Intimate Partner Violence: Not At Risk (07/18/2022)   Received from [facility], [facility]   Humiliation, Afraid, Rape, and Kick questionnaire    Fear of Current or Ex-Partner: No    Emotionally Abused: No    Physically Abused: No    Sexually Abused: No    Review of Systems   General: Negative for anorexia, weight loss, fever, chills, fatigue, weakness. ENT: Negative for hoarseness, difficulty swallowing , nasal congestion. CV: Negative for chest pain, angina, palpitations, dyspnea on exertion, peripheral edema.  Respiratory: Negative for dyspnea at rest, dyspnea on exertion, cough, sputum, wheezing.  GI: See history of present illness. GU:  Negative for dysuria, hematuria, urinary incontinence, urinary frequency, nocturnal urination.  Endo: Negative for unusual weight change.     Physical Exam   LMP 03/02/2016 Comment: Tubal Ligation    General: Well-nourished, well-developed in no acute distress.  Eyes: No icterus. Mouth: Oropharyngeal mucosa moist and pink , no lesions erythema or exudate. Lungs: Clear to auscultation bilaterally.  Heart: Regular rate and rhythm, no murmurs rubs or gallops.  Abdomen: Bowel sounds are normal, nontender, nondistended, no hepatosplenomegaly or masses,  no abdominal bruits or hernia , no rebound or guarding.  Rectal: ***  Extremities: No lower extremity edema. No clubbing or deformities. Neuro: Alert and oriented x 4   Skin: Warm and dry, no jaundice.   Psych: Alert and cooperative, normal mood and affect.  Labs   *** Imaging Studies   No results found.  Assessment       PLAN   ***   Leanna Battles. Melvyn Neth, MHS,  PA-C [facility]

## 2023-05-02 ENCOUNTER — Ambulatory Visit (INDEPENDENT_AMBULATORY_CARE_PROVIDER_SITE_OTHER): Payer: Medicaid Other | Admitting: Gastroenterology

## 2023-05-02 ENCOUNTER — Encounter: Payer: Self-pay | Admitting: Gastroenterology

## 2023-05-02 VITALS — BP 112/74 | HR 78 | Temp 99.4°F | Ht 63.0 in | Wt 213.8 lb

## 2023-05-02 DIAGNOSIS — K580 Irritable bowel syndrome with diarrhea: Secondary | ICD-10-CM

## 2023-05-02 DIAGNOSIS — K219 Gastro-esophageal reflux disease without esophagitis: Secondary | ICD-10-CM

## 2023-05-02 DIAGNOSIS — K760 Fatty (change of) liver, not elsewhere classified: Secondary | ICD-10-CM | POA: Diagnosis not present

## 2023-05-02 DIAGNOSIS — K589 Irritable bowel syndrome without diarrhea: Secondary | ICD-10-CM | POA: Diagnosis not present

## 2023-05-02 NOTE — Patient Instructions (Addendum)
Complete labs as discussed. You can continue Dexilant 60mg  daily before breakfast.  Continue to work on anti-reflux lifestyle changes, with reduction in caffeine, smoking, and weight to be added to what you are already doing with your diet.  If at some point you want to try to see if Dexilant is still needed to control your symptoms, you can start with taking every other day for 2 weeks, if tolerated then take every 3 days for two weeks, then if tolerated you can try to stop. Weight reduction is also helpful for management of fatty liver. Daily exercise if very important, try walking at least 30 minutes daily. If you decide you want to consider antireflux surgery, please let me know. Return to the office in six months or sooner if needed.

## 2023-05-06 LAB — CBC WITH DIFFERENTIAL/PLATELET
Basophils Absolute: 0.1 10*3/uL (ref 0.0–0.2)
Basos: 1 %
EOS (ABSOLUTE): 0.4 10*3/uL (ref 0.0–0.4)
Eos: 4 %
Hematocrit: 42.9 % (ref 34.0–46.6)
Hemoglobin: 13.7 g/dL (ref 11.1–15.9)
Immature Grans (Abs): 0.1 10*3/uL (ref 0.0–0.1)
Immature Granulocytes: 1 %
Lymphocytes Absolute: 2.7 10*3/uL (ref 0.7–3.1)
Lymphs: 28 %
MCH: 30.4 pg (ref 26.6–33.0)
MCHC: 31.9 g/dL (ref 31.5–35.7)
MCV: 95 fL (ref 79–97)
Monocytes Absolute: 0.9 10*3/uL (ref 0.1–0.9)
Monocytes: 9 %
Neutrophils Absolute: 5.7 10*3/uL (ref 1.4–7.0)
Neutrophils: 57 %
Platelets: 218 10*3/uL (ref 150–450)
RBC: 4.5 x10E6/uL (ref 3.77–5.28)
RDW: 13.1 % (ref 11.7–15.4)
WBC: 9.8 10*3/uL (ref 3.4–10.8)

## 2023-05-06 LAB — COMPREHENSIVE METABOLIC PANEL
ALT: 31 [IU]/L (ref 0–32)
AST: 23 [IU]/L (ref 0–40)
Albumin: 4.4 g/dL (ref 3.9–4.9)
Alkaline Phosphatase: 73 [IU]/L (ref 44–121)
BUN/Creatinine Ratio: 12 (ref 9–23)
BUN: 11 mg/dL (ref 6–20)
Bilirubin Total: <0.2 mg/dL (ref 0.0–1.2)
CO2: 21 mmol/L (ref 20–29)
Calcium: 9.3 mg/dL (ref 8.7–10.2)
Chloride: 107 mmol/L — ABNORMAL HIGH (ref 96–106)
Creatinine, Ser: 0.93 mg/dL (ref 0.57–1.00)
Globulin, Total: 2.3 g/dL (ref 1.5–4.5)
Glucose: 110 mg/dL — ABNORMAL HIGH (ref 70–99)
Potassium: 4.7 mmol/L (ref 3.5–5.2)
Sodium: 143 mmol/L (ref 134–144)
Total Protein: 6.7 g/dL (ref 6.0–8.5)
eGFR: 82 mL/min/{1.73_m2} (ref >59)

## 2023-05-06 LAB — SEDIMENTATION RATE: Sed Rate: 2 mm/h (ref 0–32)

## 2023-05-06 LAB — TISSUE TRANSGLUTAMINASE, IGA: Transglutaminase IgA: <2 U/mL (ref 0–3)

## 2023-05-06 LAB — IGA: Immunoglobulin A, (IgA) QN, Serum: 171 mg/dL (ref 87–352)

## 2023-05-06 LAB — C-REACTIVE PROTEIN: CRP: 2 mg/L (ref 0–10)

## 2023-10-31 ENCOUNTER — Ambulatory Visit: Payer: Medicaid Other | Admitting: Gastroenterology

## 2023-10-31 ENCOUNTER — Encounter: Payer: Self-pay | Admitting: Gastroenterology

## 2023-10-31 NOTE — Progress Notes (Deleted)
 GI Office Note    Referring Provider: Orpha Yancey LABOR, MD Primary Care Physician:  Orpha Yancey LABOR, MD  Primary Gastroenterologist: Ozell Hollingshead, MD   Chief Complaint   No chief complaint on file.   History of Present Illness   Sophia Douglas is a 38 y.o. female presenting today for follow up. Last seen 04/2023. She has h/o GERD, dysphagia, IBS-D, fatty liver.   Has failed pantoprazole, nexium, omeprazole, pepcid.   Reflux since age 35. EGD in 2017, 2021. Reflux esophagitis and schatzki ring in 2017.        RUQ U/S 02/2022: IMPRESSION: 1. No cholelithiasis or sonographic evidence for acute cholecystitis. 2. Increased hepatic parenchymal echogenicity suggestive of steatosis.   EGD August 2021: -Normal esophagus status post dilation -Normal stomach -Normal duodenal bulb and second portion duodenum   EGD January 2017: -Mild erosive reflux esophagitis -Schatzki ring status post dilation and disruption -Hiatal hernia  Medications   Current Outpatient Medications  Medication Sig Dispense Refill   atorvastatin (LIPITOR) 10 MG tablet Take 10 mg by mouth at bedtime.     dexlansoprazole  (DEXILANT ) 60 MG capsule TAKE ONE CAPSULE BY MOUTH EVERY DAY 90 capsule 3   FLUoxetine HCl 60 MG TABS Take 1 tablet by mouth daily.     lamoTRIgine  (LAMICTAL ) 200 MG tablet Take 200 mg by mouth 2 (two) times daily.     topiramate  (TOPAMAX ) 200 MG tablet Take 1 tablet (200 mg total) by mouth at bedtime. 90 tablet 4   No current facility-administered medications for this visit.    Allergies   Allergies as of 10/31/2023 - Review Complete 05/02/2023  Allergen Reaction Noted   Latex Rash 12/05/2012   Morphine and codeine Swelling and Rash 10/11/2011     Past Medical History   Past Medical History:  Diagnosis Date   Anxiety disorder    Bipolar disorder (HCC)    GERD (gastroesophageal reflux disease)    Gestational diabetes    Insomnia    Mood disorder (HCC)     Numbness    Left arm and left leg   Panic disorder    PONV (postoperative nausea and vomiting)    Seizures (HCC)    dx in 09/2014.S[facility] etiolgy. ON meds; last seizure was 10/29/2014.    Past Surgical History   Past Surgical History:  Procedure Laterality Date   ABDOMINAL HYSTERECTOMY  2018   CESAREAN SECTION     ESOPHAGOGASTRODUODENOSCOPY (EGD) WITH PROPOFOL  N/A 04/22/2015   Dr. Hollingshead: mild erosive reflux esophagiits, Schatzki's ring s/p dilation, hiatal hernia   ESOPHAGOGASTRODUODENOSCOPY (EGD) WITH PROPOFOL  N/A 11/06/2019   Surgeon: Hollingshead Lamar HERO, MD; Normal esophagus dilated, normal stomach and duodenum.   MALONEY DILATION N/A 04/22/2015   Procedure: AGAPITO DILATION;  Surgeon: Lamar HERO Hollingshead, MD;  [facility]: [facility];  Service: Endoscopy;  Laterality: N/A;  55   MALONEY DILATION N/A 11/06/2019   Procedure: AGAPITO DILATION;  Surgeon: Hollingshead Lamar HERO, MD;  [facility];  Service: Endoscopy;  Laterality: N/A;   TONSILLECTOMY     TUBAL LIGATION      Past Family History   Family History  Problem Relation Age of Onset   Diabetes Mother    Hypercholesterolemia Father    Hypertension Father    Neuropathy Father    Diabetes Maternal Grandmother    Kidney disease Maternal Grandmother    Heart disease Maternal Grandfather    Cancer Paternal Grandfather  rectal   Inflammatory bowel disease Neg Hx     Past Social History   Social History   Socioeconomic History   Marital status: Single    Spouse name: Not on file   Number of children: 2   Years of education: 12   Highest education level: Not on file  Occupational History   Occupation: Unemployed  Tobacco Use   Smoking status: Every Day    Current packs/day: 1.00    Average packs/day: 1 pack/day for 10.0 years (10.0 ttl pk-yrs)    Types: Cigarettes   Smokeless tobacco: Never  Vaping Use   Vaping status: Never Used  Substance and Sexual Activity   Alcohol use: No    Alcohol/week: 0.0  standard drinks of alcohol   Drug use: No   Sexual activity: Yes    Birth control/protection: Surgical  Other Topics Concern   Not on file  Social History Narrative   Lives at home with her two children (son and daughter).    Patient is unemployed.   Right-handed.   One cup coffee per day.   Social Drivers of Corporate investment banker Strain: Not on file  Food Insecurity: Not on file  Transportation Needs: Not on file  Physical Activity: Not on file  Stress: Not on file  Social Connections: Not on file  Intimate Partner Violence: Not At Risk (07/18/2022)   Received from [facility]   Humiliation, Afraid, Rape, and Kick questionnaire    Within the last year, have you been afraid of your partner or ex-partner?: No    Within the last year, have you been humiliated or emotionally abused in other ways by your partner or ex-partner?: No    Within the last year, have you been kicked, hit, slapped, or otherwise physically hurt by your partner or ex-partner?: No    Within the last year, have you been raped or forced to have any kind of sexual activity by your partner or ex-partner?: No    Review of Systems   General: Negative for anorexia, weight loss, fever, chills, fatigue, weakness. ENT: Negative for hoarseness, difficulty swallowing , nasal congestion. CV: Negative for chest pain, angina, palpitations, dyspnea on exertion, peripheral edema.  Respiratory: Negative for dyspnea at rest, dyspnea on exertion, cough, sputum, wheezing.  GI: See history of present illness. GU:  Negative for dysuria, hematuria, urinary incontinence, urinary frequency, nocturnal urination.  Endo: Negative for unusual weight change.     Physical Exam   LMP 03/02/2016 Comment: Tubal Ligation    General: Well-nourished, well-developed in no acute distress.  Eyes: No icterus. Mouth: Oropharyngeal mucosa moist and pink   Lungs: Clear to auscultation bilaterally.  Heart: Regular rate and rhythm, no  murmurs rubs or gallops.  Abdomen: Bowel sounds are normal, nontender, nondistended, no hepatosplenomegaly or masses,  no abdominal bruits or hernia , no rebound or guarding.  Rectal: not performed Extremities: No lower extremity edema. No clubbing or deformities. Neuro: Alert and oriented x 4   Skin: Warm and dry, no jaundice.   Psych: Alert and cooperative, normal mood and affect.  Labs   Lab Results  Component Value Date   NA 143 05/02/2023   CL 107 (H) 05/02/2023   K 4.7 05/02/2023   CO2 21 05/02/2023   BUN 11 05/02/2023   CREATININE 0.93 05/02/2023   EGFR 82 05/02/2023   CALCIUM 9.3 05/02/2023   ALBUMIN 4.4 05/02/2023   GLUCOSE 110 (H) 05/02/2023   Lab Results  Component  Value Date   ALT 31 05/02/2023   AST 23 05/02/2023   ALKPHOS 73 05/02/2023   BILITOT <0.2 05/02/2023   Lab Results  Component Value Date   WBC 9.8 05/02/2023   HGB 13.7 05/02/2023   HCT 42.9 05/02/2023   MCV 95 05/02/2023   PLT 218 05/02/2023   Lab Results  Component Value Date   CRP 2 05/02/2023   Lab Results  Component Value Date   ESRSEDRATE 2 05/02/2023   TTG IgA <2, IgA 171  Imaging Studies   No results found.  Assessment/Plan:     GERD: -doing very well -history of complicated GERD with Schatzki ring in 2017, reflux esophagitis at that time as well.  Follow-up EGD 2021 with normal esophagus.  Stomach has been normal on both endoscopies. -We discussed further antireflux measures such as weight reduction, smoking sensation, limiting caffeine and carbonation.  She is to continue other dietary measures, waiting 3 hours between laying down and eating. -At this time she is fearful to try to come off of PPI as she did not tolerate this last year.  If she is able to make significant lifestyle changes, she can consider weaning off Dexilant , instruction provided.  At any time if she has recurrent symptoms, she can resume medication because benefits outweigh risk at this time.  Patient is  not interested in stopping PPI therapy. -No indication for EGD at this time. -Discussed potential antireflux surgeries, she will let us  know if she is interested in the future. -Return office visit in 6 months.   IBS: -Stable -Screen for celiac and inflammatory bowel disease -Use Imodium as needed   Fatty liver:  -Check labs including CBC, CMET for NAFLD score -Recommend weight loss, daily exercise -Return office visit in 6 months   ***   Sonny RAMAN. Ezzard, MHS, PA-C [facility]

## 2024-01-03 ENCOUNTER — Other Ambulatory Visit: Payer: Self-pay | Admitting: Gastroenterology

## 2024-01-03 DIAGNOSIS — K219 Gastro-esophageal reflux disease without esophagitis: Secondary | ICD-10-CM
